# Patient Record
Sex: Female | Born: 1960 | Race: White | Hispanic: No | Marital: Married | Smoking: Former smoker
Health system: Southern US, Community
[De-identification: ages and names within clinical notes are randomized; demographics above are authoritative.]

## PROBLEM LIST (undated history)

## (undated) DIAGNOSIS — K635 Polyp of colon: Secondary | ICD-10-CM

## (undated) DIAGNOSIS — A630 Anogenital (venereal) warts: Secondary | ICD-10-CM

## (undated) DIAGNOSIS — N390 Urinary tract infection, site not specified: Secondary | ICD-10-CM

## (undated) DIAGNOSIS — M1990 Unspecified osteoarthritis, unspecified site: Secondary | ICD-10-CM

## (undated) DIAGNOSIS — C50919 Malignant neoplasm of unspecified site of unspecified female breast: Secondary | ICD-10-CM

## (undated) DIAGNOSIS — Z923 Personal history of irradiation: Secondary | ICD-10-CM

## (undated) HISTORY — PX: FOREARM SURGERY: SHX651

## (undated) HISTORY — DX: Polyp of colon: K63.5

## (undated) HISTORY — DX: Urinary tract infection, site not specified: N39.0

## (undated) HISTORY — PX: COSMETIC SURGERY: SHX468

## (undated) HISTORY — DX: Anogenital (venereal) warts: A63.0

## (undated) HISTORY — PX: AUGMENTATION MAMMAPLASTY: SUR837

## (undated) HISTORY — PX: BREAST SURGERY: SHX581

## (undated) HISTORY — DX: Malignant neoplasm of unspecified site of unspecified female breast: C50.919

## (undated) HISTORY — DX: Unspecified osteoarthritis, unspecified site: M19.90

---

## 1998-10-02 ENCOUNTER — Inpatient Hospital Stay (HOSPITAL_COMMUNITY): Admission: AD | Admit: 1998-10-02 | Discharge: 1998-10-04 | Payer: Self-pay | Admitting: Obstetrics and Gynecology

## 1998-11-22 ENCOUNTER — Other Ambulatory Visit: Admission: RE | Admit: 1998-11-22 | Discharge: 1998-11-22 | Payer: Self-pay | Admitting: Obstetrics and Gynecology

## 1999-07-24 ENCOUNTER — Ambulatory Visit (HOSPITAL_COMMUNITY): Admission: RE | Admit: 1999-07-24 | Discharge: 1999-07-24 | Payer: Self-pay | Admitting: Internal Medicine

## 1999-07-24 ENCOUNTER — Encounter: Payer: Self-pay | Admitting: Internal Medicine

## 1999-11-24 ENCOUNTER — Other Ambulatory Visit: Admission: RE | Admit: 1999-11-24 | Discharge: 1999-11-24 | Payer: Self-pay | Admitting: Obstetrics and Gynecology

## 2000-10-26 HISTORY — PX: AUGMENTATION MAMMAPLASTY: SUR837

## 2000-12-13 ENCOUNTER — Other Ambulatory Visit: Admission: RE | Admit: 2000-12-13 | Discharge: 2000-12-13 | Payer: Self-pay | Admitting: Obstetrics and Gynecology

## 2001-07-20 ENCOUNTER — Ambulatory Visit (HOSPITAL_BASED_OUTPATIENT_CLINIC_OR_DEPARTMENT_OTHER): Admission: RE | Admit: 2001-07-20 | Discharge: 2001-07-21 | Payer: Self-pay | Admitting: *Deleted

## 2001-12-14 ENCOUNTER — Other Ambulatory Visit: Admission: RE | Admit: 2001-12-14 | Discharge: 2001-12-14 | Payer: Self-pay | Admitting: Obstetrics and Gynecology

## 2002-07-12 ENCOUNTER — Encounter: Payer: Self-pay | Admitting: General Surgery

## 2002-07-12 ENCOUNTER — Encounter: Admission: RE | Admit: 2002-07-12 | Discharge: 2002-07-12 | Payer: Self-pay | Admitting: General Surgery

## 2003-07-26 ENCOUNTER — Other Ambulatory Visit: Admission: RE | Admit: 2003-07-26 | Discharge: 2003-07-26 | Payer: Self-pay | Admitting: Obstetrics and Gynecology

## 2003-08-08 ENCOUNTER — Encounter: Admission: RE | Admit: 2003-08-08 | Discharge: 2003-08-08 | Payer: Self-pay | Admitting: Obstetrics and Gynecology

## 2003-08-08 ENCOUNTER — Encounter: Payer: Self-pay | Admitting: Obstetrics and Gynecology

## 2004-08-11 ENCOUNTER — Encounter: Admission: RE | Admit: 2004-08-11 | Discharge: 2004-08-11 | Payer: Self-pay

## 2005-01-06 ENCOUNTER — Ambulatory Visit: Payer: Self-pay | Admitting: Internal Medicine

## 2005-04-02 ENCOUNTER — Ambulatory Visit: Payer: Self-pay | Admitting: Internal Medicine

## 2005-04-03 ENCOUNTER — Encounter: Admission: RE | Admit: 2005-04-03 | Discharge: 2005-04-03 | Payer: Self-pay | Admitting: Internal Medicine

## 2005-11-19 ENCOUNTER — Encounter: Admission: RE | Admit: 2005-11-19 | Discharge: 2005-11-19 | Payer: Self-pay | Admitting: Obstetrics and Gynecology

## 2005-12-02 ENCOUNTER — Ambulatory Visit: Payer: Self-pay | Admitting: Internal Medicine

## 2006-11-30 ENCOUNTER — Encounter: Admission: RE | Admit: 2006-11-30 | Discharge: 2006-11-30 | Payer: Self-pay | Admitting: Obstetrics and Gynecology

## 2007-01-24 ENCOUNTER — Ambulatory Visit: Payer: Self-pay | Admitting: Internal Medicine

## 2007-02-22 ENCOUNTER — Ambulatory Visit: Payer: Self-pay | Admitting: Internal Medicine

## 2007-03-07 DIAGNOSIS — L708 Other acne: Secondary | ICD-10-CM

## 2007-03-18 ENCOUNTER — Encounter: Payer: Self-pay | Admitting: Internal Medicine

## 2007-04-12 ENCOUNTER — Ambulatory Visit: Payer: Self-pay | Admitting: Internal Medicine

## 2007-04-25 ENCOUNTER — Ambulatory Visit: Payer: Self-pay | Admitting: Internal Medicine

## 2007-04-29 LAB — CONVERTED CEMR LAB
Basophils Absolute: 0 10*3/uL (ref 0.0–0.1)
Basophils Relative: 0.4 % (ref 0.0–1.0)
HCT: 37.7 % (ref 36.0–46.0)
Hemoglobin: 12.8 g/dL (ref 12.0–15.0)
Lymphocytes Relative: 38.7 % (ref 12.0–46.0)
MCHC: 33.9 g/dL (ref 30.0–36.0)
MCV: 93.8 fL (ref 78.0–100.0)
RDW: 11.1 % — ABNORMAL LOW (ref 11.5–14.6)
T3, Free: 2.7 pg/mL (ref 2.3–4.2)
TSH: 0.34 microintl units/mL — ABNORMAL LOW (ref 0.35–5.50)

## 2007-05-02 ENCOUNTER — Ambulatory Visit: Payer: Self-pay | Admitting: Internal Medicine

## 2007-05-02 ENCOUNTER — Encounter (INDEPENDENT_AMBULATORY_CARE_PROVIDER_SITE_OTHER): Payer: Self-pay | Admitting: *Deleted

## 2007-05-03 ENCOUNTER — Encounter (INDEPENDENT_AMBULATORY_CARE_PROVIDER_SITE_OTHER): Payer: Self-pay | Admitting: *Deleted

## 2007-05-10 ENCOUNTER — Encounter (INDEPENDENT_AMBULATORY_CARE_PROVIDER_SITE_OTHER): Payer: Self-pay | Admitting: *Deleted

## 2007-05-11 ENCOUNTER — Encounter (INDEPENDENT_AMBULATORY_CARE_PROVIDER_SITE_OTHER): Payer: Self-pay | Admitting: *Deleted

## 2007-09-15 ENCOUNTER — Emergency Department (HOSPITAL_COMMUNITY): Admission: EM | Admit: 2007-09-15 | Discharge: 2007-09-15 | Payer: Self-pay | Admitting: Emergency Medicine

## 2007-09-15 ENCOUNTER — Telehealth: Payer: Self-pay | Admitting: Internal Medicine

## 2007-09-20 ENCOUNTER — Ambulatory Visit (HOSPITAL_COMMUNITY): Admission: RE | Admit: 2007-09-20 | Discharge: 2007-09-20 | Payer: Self-pay | Admitting: Neurosurgery

## 2008-01-27 ENCOUNTER — Encounter: Admission: RE | Admit: 2008-01-27 | Discharge: 2008-01-27 | Payer: Self-pay | Admitting: Obstetrics and Gynecology

## 2008-08-31 ENCOUNTER — Encounter: Payer: Self-pay | Admitting: Internal Medicine

## 2009-02-07 ENCOUNTER — Encounter: Admission: RE | Admit: 2009-02-07 | Discharge: 2009-02-07 | Payer: Self-pay | Admitting: Obstetrics and Gynecology

## 2009-03-20 ENCOUNTER — Encounter: Payer: Self-pay | Admitting: Internal Medicine

## 2009-08-23 ENCOUNTER — Encounter: Payer: Self-pay | Admitting: Internal Medicine

## 2009-10-26 HISTORY — PX: BREAST BIOPSY: SHX20

## 2010-02-27 ENCOUNTER — Encounter: Admission: RE | Admit: 2010-02-27 | Discharge: 2010-02-27 | Payer: Self-pay | Admitting: Obstetrics and Gynecology

## 2010-03-04 ENCOUNTER — Encounter: Admission: RE | Admit: 2010-03-04 | Discharge: 2010-03-04 | Payer: Self-pay | Admitting: Obstetrics and Gynecology

## 2010-09-02 ENCOUNTER — Encounter: Payer: Self-pay | Admitting: Internal Medicine

## 2010-09-04 ENCOUNTER — Encounter: Admission: RE | Admit: 2010-09-04 | Discharge: 2010-09-04 | Payer: Self-pay | Admitting: Obstetrics and Gynecology

## 2010-10-26 ENCOUNTER — Ambulatory Visit (HOSPITAL_COMMUNITY): Admission: RE | Admit: 2010-10-26 | Payer: Self-pay | Admitting: Obstetrics and Gynecology

## 2010-11-23 LAB — CONVERTED CEMR LAB
ALT: 19 units/L (ref 0–40)
Albumin: 3.8 g/dL (ref 3.5–5.2)
BUN: 7 mg/dL (ref 6–23)
CO2: 26 meq/L (ref 19–32)
Chloride: 105 meq/L (ref 96–112)
Eosinophils Absolute: 0.1 10*3/uL (ref 0.0–0.6)
Eosinophils Relative: 2.5 % (ref 0.0–5.0)
Glucose, Bld: 96 mg/dL (ref 70–99)
HCT: 35.3 % — ABNORMAL LOW (ref 36.0–46.0)
Hemoglobin: 12.1 g/dL (ref 12.0–15.0)
Lymphocytes Relative: 27.9 % (ref 12.0–46.0)
MCV: 94.2 fL (ref 78.0–100.0)
Monocytes Absolute: 0.3 10*3/uL (ref 0.2–0.7)
Monocytes Relative: 6.1 % (ref 3.0–11.0)
Neutro Abs: 3.4 10*3/uL (ref 1.4–7.7)
Neutrophils Relative %: 62.9 % (ref 43.0–77.0)
Platelets: 230 10*3/uL (ref 150–400)
Potassium: 3.5 meq/L (ref 3.5–5.1)
TSH: 0.21 microintl units/mL — ABNORMAL LOW (ref 0.35–5.50)
Total Bilirubin: 0.7 mg/dL (ref 0.3–1.2)
Total CHOL/HDL Ratio: 3.6
Total Protein: 5.9 g/dL — ABNORMAL LOW (ref 6.0–8.3)
Triglycerides: 92 mg/dL (ref 0–149)

## 2010-11-27 NOTE — Consult Note (Signed)
Summary: [address] OD  [address] OD   Imported By: Lanelle Bal 09/15/2010 13:19:37  _____________________________________________________________________  External Attachment:    Type:   Image     Comment:   External Document

## 2011-03-10 NOTE — Consult Note (Signed)
NAME:  Stacey Craig, Stacey Craig           ACCOUNT NO.:  [id number]   MEDICAL RECORD NO.:  [id number]          PATIENT TYPE:  EMS   LOCATION:  MAJO                         FACILITY:  [facility]   PHYSICIAN:  Donalee Citrin, M.D.        DATE OF BIRTH:  [birth year]   DATE OF CONSULTATION:  09/15/2007  DATE OF DISCHARGE:                                 CONSULTATION   EMERGENCY ROOM CONSULTATION   REASON FOR CONSULTATION:  Headaches.   HISTORY OF PRESENT ILLNESS:  The patient is a very pleasant 50 year old  female who is very healthy and in her usual state of health until  Wednesday when she noticed a very slow to progress, began early after  lunch, progressed to severe by the evening over several hours, a  headache that is characterized predominantly as being a retroorbital.  Was associated with a little bit of nausea.  She took an ibuprofen, went  to bed, and felt fine.  Did great all day Wednesday, and then last  night, did not remember anything abnormal.  Went to bed, woke up this  morning, and her husband told her last night she was acting a little bit  drunk.  She does not remember any of that.  Currently, she is not  complaining of any headaches.  She denies any numbness or tingling.  She  denies any nausea or vomiting.  She says that these headaches both times  were not associated with any aura.  No unusual smells, scintillations,  bright lights, jagged lines.  Does have a little bit of nausea, there  was no vomiting and no other associated neurological findings, word  finding, double vision or vision loss.   PAST MEDICAL HISTORY:  Unremarkable.   MEDICATIONS:  None except for an occasional oral contraceptive for her  face.  Otherwise, that is it.   PHYSICAL EXAMINATION:  GENERAL:  She is awake, alert and oriented 85-  year-old female in no acute distress.  HEENT:  Within normal limits with pupils equal, round and reactive to  light.  Extraocular movements are intact.  Fundi are sharp with  good  visualization.  No visual-field loss.  Cranial nerves are otherwise  intact.  EXTREMITIES-  Strength is 5/5 in her deltoids, biceps, triceps, wrist  flexors and intrinsics and also lower extremity strength is 5/5  in  iliopsoas, quads, hamstrings, gastrocs, and EHL.Marland Kitchen  CEREBELLAR EXAM:  Negative with no signs of dysmetria.  SENSORY EXAM:  Grossly normal to light touch.   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  This appears to be a simple venous angioma; however, there  is a fairly large vein associated with it.  I have recommended an  outpatient arteriogram.  Continue the use of Tylenol and occasional  Advil for pain.  I explained to her this a very low risk of amnesia, and  I was not even sure it was causing her headaches, that if the  arteriogram confirmed that it is as  characterized a venous angioma, that  probably no treatment would be needed.  Her headaches, I think, are a  different entity, and I have recommended followup with neurology for  headache management, and I will see her back after arteriogram in  approximately 1 week.  In addition, would follow up with Dr. Sharene Skeans.           ______________________________  Donalee Citrin, M.D.     GC/MEDQ  D:  09/15/2007  T:  09/16/2007  Job:  [id number]

## 2011-03-13 NOTE — Letter (Signed)
February 22, 2007    Aundra Dubin, M.D.  [address]  [place], [place] [address]   RE:  Stacey Craig, Stacey Craig  MRN:  [id number]  /  DOB:  1960/11/05   Dear Will:   Jossette was seen on March 31 with pain in the right hand with some  associated swelling.  Arthritis Strength Tylenol at bedtime and 1 to 3  baby aspirin in the morning was recommended.  Only minor PIP changes  were seen.   She returns stating that these interventions were of no benefit.   Now she has pain in the right hand, right shoulder, lower neck and upper  back, as well as the left elbow.  This has been associated with  decreased strength.  She has no constitutional symptoms or rash.   She has competed in high school athletics as well as intramurals in  college and has been a runner with some joint symptoms.   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  At that time, her sed rate and uric acid  were normal, but she did have a rheumatoid factor of 28.6.   Her mother has mild arthritis, but there is no other history of  significant collagen vascular disease.  I will repeat the sed rate and  rheumatoid arthritis factor.  Pending your evaluation, I will prescribe  Voltaren 50 mg twice a day with meals as needed.   On exam, she does have some instability in the left knee, but full range  of motion.  She does describe some discomfort with neck flexion.  Strength was normal to opposition & her deep tendon reflexes were also  WNL.    Sincerely,      Titus Dubin. Alwyn Ren, MD,FACP,FCCP  Electronically Signed    [facility]/MedQ  DD: 02/22/2007  DT: 02/22/2007  Job #: [id number]

## 2011-03-13 NOTE — Op Note (Signed)
. [facility]  Patient:    Stacey Craig, Stacey Craig Visit Number: [id number] MRN: [id number]          Service Type: DSU Location: [facility] Attending Physician:  Kendell Bane Dictated by:   Lowell Bouton, M.D. Proc. Date: 07/20/01 Admit Date:  07/20/2001   CC:         Alinda Deem, M.D.  Titus Dubin. Alwyn Ren, M.D. 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  POSTOPERATIVE DIAGNOSIS:  Central triangular fibrocartilage tear with ulnocarpal abutment, right wrist with nondisplaced articular fracture of the distal radius.  OPERATION PERFORMED:  Diagnostic arthroscopy, right wrist with open ulnar shortening.  SURGEON:  Lowell Bouton, M.D.  ANESTHESIA:  General.  OPERATIVE FINDINGS:  The patient had an articular fracture in the distal radius ulnarly.  The cartilage was not displaced.  The ulnar head was seen easily from the radiocarpal portal through a large central triangular fibrocartilage tear.  The triangular fibrocartilage was completely worn down; however, the distal end of the ulna was smooth and there was not significant articular damage.  There was some dorsal synovitis and the scapholunate ligament and triquetrolunate ligaments appeared to be intact.  DESCRIPTION OF PROCEDURE:  Under general anesthesia with the tourniquet on the left arm, the left hand was prepped and draped in the usual fashion and the index, middle and ring ringers were suspended from finger traps in the arthroscopy tower.  The upper arm was attached to the arm board with a Velcro strap for traction.  10 pounds of traction were applied across the wrist and the radiocarpal joint was inflated with 5 cc of saline.  An outflow was established at the 6-U portal in the ulnocarpal joint.  An 11 blade was used to make an incision just ulnar to the extensor  pollicis longus tendon at the radiocarpal joint.  The 3-4 portal was then established with the arthroscope using a sharp and blunt trocar.  Inflow was applied to the arthroscope and the pump was used for irrigation.  Examination of the radiocarpal joint revealed a smooth proximal scaphoid with no articular damage on the radial side of the radius.  Going more ulnarly the scapholunate ligament appeared to be intact. There was a defect in the articular surface of the radius just on the ulnar aspect.  The articular surface was smooth and there was no need for any debridement.  The shaver was inserted for synovitis dorsally in the 4-5 portal and the synovium dorsally was debrided with a full radius resector. This allowed better visualization of the ulnar side of the wrist.  The triangular fibrocartilage was then examined after clearing off the synovium and there was a large central defect in the triangular fibrocartilage.  The ulnar head protruded through this defect and the articular surface of the distal ulna was smooth.  The ulnar side of the joint otherwise appeared smooth except for the defect in the triangular fibrocartilage.  The arthroscope was then removed and it was determined that an ulnar shortening would be required.  The limb was exsanguinated and the tourniquet was inflated to 225 mmHg.  A 10 cm longitudinal incision was then made on the subcutaneous border of the ulna and carried down through the subcutaneous tissues.  Bleeding points were coagulated.  Sharp dissection was carried down between the flexor and extensor muscle mass onto the subcutaneous border of the ulna.  The periosteum was incised and  a Therapist, nutritional was used to elevate the muscles volarly and dorsally to allow fixation of the Rayhack shortening device.  The saw guide was affixed to the ulna with 3.5 cortical screws from the small fragments. These were placed in the appropriate holes.  This affixed the saw guide  to the ulna.  Parallel saw cuts were then made, cutting the distal one first in the second position and the proximal one in the first position.  This allowed a wafer of bone to be removed measuring 2.5 mm of shortening.  The saw guide was then removed and the plate was attached through the previous drill holes.  A compression device was attached to the plate and the osteotomy site was compressed so it was no longer visible.  A drill guide was then attached to the compression device allowing a 2.7 lag screw from the minifragment set to be inserted across the osteotomy site.  This compressed the osteotomy site. The two distal screws were then inserted using 3.5 mm screws.  The compression device was then removed.  The screws were tightened and x-ray showed good alignment of the osteotomy and good length of the screws.  The shortening appeared to be appropriate of the ulna.  The wound was then copiously irrigated.  The subcutaneous tissue was closed with 4-0 Vicryl over a vessel loop drain.  The skin was closed with a 3-0 subcuticular Prolene. Steri-Strips were applied followed by sterile dressings and a sugar-tong splint.  The patient tolerated the procedure well and went to the recovery room awake and stable in good condition. Dictated by:   Lowell Bouton, M.D. Attending Physician:  Kendell Bane DD:  07/20/01 TD:  07/20/01 Job: [id number]

## 2011-04-27 ENCOUNTER — Other Ambulatory Visit: Payer: Self-pay | Admitting: Obstetrics and Gynecology

## 2011-04-27 DIAGNOSIS — R921 Mammographic calcification found on diagnostic imaging of breast: Secondary | ICD-10-CM

## 2011-05-07 ENCOUNTER — Ambulatory Visit
Admission: RE | Admit: 2011-05-07 | Discharge: 2011-05-07 | Disposition: A | Discharge status: Home / Self Care | Payer: BC Managed Care – PPO | Source: Ambulatory Visit | Attending: Obstetrics and Gynecology | Admitting: Obstetrics and Gynecology

## 2011-05-07 DIAGNOSIS — R921 Mammographic calcification found on diagnostic imaging of breast: Secondary | ICD-10-CM

## 2011-08-04 LAB — DIFFERENTIAL
Basophils Absolute: 0
Eosinophils Absolute: 0 — ABNORMAL LOW
Lymphocytes Relative: 21
Lymphs Abs: 1.7
Monocytes Absolute: 0.2
Neutrophils Relative %: 75

## 2011-08-04 LAB — RAPID URINE DRUG SCREEN, HOSP PERFORMED
Barbiturates: NOT DETECTED
Benzodiazepines: NOT DETECTED
Cocaine: NOT DETECTED
Opiates: NOT DETECTED
Tetrahydrocannabinol: NOT DETECTED

## 2011-08-04 LAB — BASIC METABOLIC PANEL
BUN: 12
CO2: 26
Chloride: 105
GFR calc non Af Amer: >60
Glucose, Bld: 89
Potassium: 3.8
Sodium: 138

## 2011-08-04 LAB — CBC
HCT: 35.6 — ABNORMAL LOW
Hemoglobin: 12.4
MCHC: 34.7
MCHC: 35.3
MCV: 91
Platelets: 174
Platelets: 194
RDW: 11.6
WBC: 7.8

## 2011-08-04 LAB — I-STAT 8, (EC8 V) (CONVERTED LAB)
Hemoglobin: 12.9
Potassium: 4.1
Sodium: 138
pCO2, Ven: 40.1 — ABNORMAL LOW
pH, Ven: 7.379 — ABNORMAL HIGH

## 2011-08-04 LAB — ETHANOL: Alcohol, Ethyl (B): <5

## 2011-08-04 LAB — POCT PREGNANCY, URINE: Operator id: 146091

## 2011-11-10 ENCOUNTER — Other Ambulatory Visit: Payer: Self-pay | Admitting: Obstetrics and Gynecology

## 2011-11-10 DIAGNOSIS — R921 Mammographic calcification found on diagnostic imaging of breast: Secondary | ICD-10-CM

## 2011-11-24 ENCOUNTER — Ambulatory Visit
Admission: RE | Admit: 2011-11-24 | Discharge: 2011-11-24 | Disposition: A | Discharge status: Home / Self Care | Payer: BC Managed Care – PPO | Source: Ambulatory Visit | Attending: Obstetrics and Gynecology | Admitting: Obstetrics and Gynecology

## 2011-11-24 DIAGNOSIS — R921 Mammographic calcification found on diagnostic imaging of breast: Secondary | ICD-10-CM

## 2012-05-19 ENCOUNTER — Other Ambulatory Visit: Payer: Self-pay | Admitting: Obstetrics and Gynecology

## 2012-05-19 DIAGNOSIS — Z1231 Encounter for screening mammogram for malignant neoplasm of breast: Secondary | ICD-10-CM

## 2012-06-13 ENCOUNTER — Ambulatory Visit
Admission: RE | Admit: 2012-06-13 | Discharge: 2012-06-13 | Disposition: A | Discharge status: Home / Self Care | Payer: PRIVATE HEALTH INSURANCE | Source: Ambulatory Visit | Attending: Obstetrics and Gynecology | Admitting: Obstetrics and Gynecology

## 2012-06-13 DIAGNOSIS — Z1231 Encounter for screening mammogram for malignant neoplasm of breast: Secondary | ICD-10-CM

## 2012-06-16 ENCOUNTER — Other Ambulatory Visit: Payer: Self-pay | Admitting: Obstetrics and Gynecology

## 2012-06-16 DIAGNOSIS — R928 Other abnormal and inconclusive findings on diagnostic imaging of breast: Secondary | ICD-10-CM

## 2012-06-20 ENCOUNTER — Ambulatory Visit
Admission: RE | Admit: 2012-06-20 | Discharge: 2012-06-20 | Disposition: A | Discharge status: Home / Self Care | Payer: PRIVATE HEALTH INSURANCE | Source: Ambulatory Visit | Attending: Obstetrics and Gynecology | Admitting: Obstetrics and Gynecology

## 2012-06-20 DIAGNOSIS — R928 Other abnormal and inconclusive findings on diagnostic imaging of breast: Secondary | ICD-10-CM

## 2013-06-23 ENCOUNTER — Other Ambulatory Visit: Payer: Self-pay

## 2013-06-23 DIAGNOSIS — Z1231 Encounter for screening mammogram for malignant neoplasm of breast: Secondary | ICD-10-CM

## 2013-07-25 ENCOUNTER — Ambulatory Visit
Admission: RE | Admit: 2013-07-25 | Discharge: 2013-07-25 | Disposition: A | Discharge status: Home / Self Care | Payer: BC Managed Care – PPO | Source: Ambulatory Visit

## 2013-07-25 DIAGNOSIS — Z1231 Encounter for screening mammogram for malignant neoplasm of breast: Secondary | ICD-10-CM

## 2013-07-31 ENCOUNTER — Other Ambulatory Visit: Payer: Self-pay | Admitting: Obstetrics and Gynecology

## 2013-07-31 DIAGNOSIS — R928 Other abnormal and inconclusive findings on diagnostic imaging of breast: Secondary | ICD-10-CM

## 2013-08-10 ENCOUNTER — Ambulatory Visit
Admission: RE | Admit: 2013-08-10 | Discharge: 2013-08-10 | Disposition: A | Discharge status: Home / Self Care | Payer: BC Managed Care – PPO | Source: Ambulatory Visit | Attending: Obstetrics and Gynecology | Admitting: Obstetrics and Gynecology

## 2013-08-10 DIAGNOSIS — R928 Other abnormal and inconclusive findings on diagnostic imaging of breast: Secondary | ICD-10-CM

## 2014-09-07 ENCOUNTER — Other Ambulatory Visit: Payer: Self-pay

## 2014-09-07 DIAGNOSIS — Z1231 Encounter for screening mammogram for malignant neoplasm of breast: Secondary | ICD-10-CM

## 2014-10-02 ENCOUNTER — Ambulatory Visit
Admission: RE | Admit: 2014-10-02 | Discharge: 2014-10-02 | Disposition: A | Discharge status: Home / Self Care | Payer: BC Managed Care – PPO | Source: Ambulatory Visit

## 2014-10-02 DIAGNOSIS — Z1231 Encounter for screening mammogram for malignant neoplasm of breast: Secondary | ICD-10-CM

## 2014-10-04 ENCOUNTER — Other Ambulatory Visit: Payer: Self-pay | Admitting: Obstetrics and Gynecology

## 2014-10-04 DIAGNOSIS — R928 Other abnormal and inconclusive findings on diagnostic imaging of breast: Secondary | ICD-10-CM

## 2014-10-17 ENCOUNTER — Ambulatory Visit
Admission: RE | Admit: 2014-10-17 | Discharge: 2014-10-17 | Disposition: A | Discharge status: Home / Self Care | Payer: BC Managed Care – PPO | Source: Ambulatory Visit | Attending: Obstetrics and Gynecology | Admitting: Obstetrics and Gynecology

## 2014-10-17 DIAGNOSIS — R928 Other abnormal and inconclusive findings on diagnostic imaging of breast: Secondary | ICD-10-CM

## 2015-04-10 ENCOUNTER — Ambulatory Visit: Payer: Self-pay | Admitting: Family

## 2015-04-24 ENCOUNTER — Other Ambulatory Visit: Payer: Self-pay | Admitting: Obstetrics and Gynecology

## 2015-04-24 ENCOUNTER — Other Ambulatory Visit: Payer: Self-pay | Admitting: Internal Medicine

## 2015-04-24 DIAGNOSIS — C50911 Malignant neoplasm of unspecified site of right female breast: Secondary | ICD-10-CM

## 2015-04-24 DIAGNOSIS — Z9882 Breast implant status: Secondary | ICD-10-CM

## 2015-05-01 ENCOUNTER — Other Ambulatory Visit: Payer: Self-pay

## 2015-05-08 ENCOUNTER — Ambulatory Visit
Admission: RE | Admit: 2015-05-08 | Discharge: 2015-05-08 | Disposition: A | Discharge status: Home / Self Care | Payer: No Typology Code available for payment source | Source: Ambulatory Visit | Attending: Internal Medicine | Admitting: Internal Medicine

## 2015-05-08 ENCOUNTER — Other Ambulatory Visit: Payer: Self-pay | Admitting: Internal Medicine

## 2015-05-08 DIAGNOSIS — C50911 Malignant neoplasm of unspecified site of right female breast: Secondary | ICD-10-CM

## 2015-05-08 DIAGNOSIS — Z9882 Breast implant status: Secondary | ICD-10-CM

## 2015-05-13 ENCOUNTER — Ambulatory Visit: Payer: Self-pay | Admitting: Internal Medicine

## 2015-06-06 ENCOUNTER — Ambulatory Visit: Payer: Self-pay | Admitting: Internal Medicine

## 2015-06-21 ENCOUNTER — Ambulatory Visit (INDEPENDENT_AMBULATORY_CARE_PROVIDER_SITE_OTHER): Payer: No Typology Code available for payment source | Admitting: Internal Medicine

## 2015-06-21 ENCOUNTER — Encounter: Payer: Self-pay | Admitting: Internal Medicine

## 2015-06-21 ENCOUNTER — Other Ambulatory Visit (INDEPENDENT_AMBULATORY_CARE_PROVIDER_SITE_OTHER): Payer: No Typology Code available for payment source

## 2015-06-21 VITALS — BP 118/78 | HR 82 | Temp 98.6°F | Resp 16 | Ht 63.5 in | Wt 145.0 lb

## 2015-06-21 DIAGNOSIS — M069 Rheumatoid arthritis, unspecified: Secondary | ICD-10-CM | POA: Diagnosis not present

## 2015-06-21 DIAGNOSIS — Z0000 Encounter for general adult medical examination without abnormal findings: Secondary | ICD-10-CM | POA: Diagnosis not present

## 2015-06-21 DIAGNOSIS — R635 Abnormal weight gain: Secondary | ICD-10-CM | POA: Diagnosis not present

## 2015-06-21 LAB — LIPID PANEL
CHOLESTEROL: 198 mg/dL (ref 0–200)
HDL: 78.8 mg/dL (ref >39.00)
LDL Cholesterol: 105 mg/dL — ABNORMAL HIGH (ref 0–99)
NonHDL: 118.9
Total CHOL/HDL Ratio: 3
Triglycerides: 70 mg/dL (ref 0.0–149.0)
VLDL: 14 mg/dL (ref 0.0–40.0)

## 2015-06-21 LAB — TSH: TSH: 0.41 u[IU]/mL (ref 0.35–4.50)

## 2015-06-21 LAB — T4, FREE: FREE T4: 0.86 ng/dL (ref 0.60–1.60)

## 2015-06-21 LAB — VITAMIN B12: VITAMIN B 12: 407 pg/mL (ref 211–911)

## 2015-06-21 NOTE — Assessment & Plan Note (Signed)
Check B12 and TSH and free T4. She is at a healthy weight. Spoke with her about exercise and diet to help with weight control.

## 2015-06-21 NOTE — Progress Notes (Signed)
   Subjective:    Patient ID: Stacey Craig, female    DOB: 05-18-1961, 54 y.o.   MRN: [id number]  HPI The patient is a 54 YO female coming in for weight gain. She has noticed in the last 1 year she has put on 25 pounds without significant change in lifestyle. Previously she has had problems with her thyroid and checked in May and was told it was okay. Has concurrent RA which is well controlled. No constipation, tremors, diarrhea. No fatigue or heat or cold intolerance.   PMH, [facility], social history reviewed and updated.   Review of Systems  Constitutional: Positive for unexpected weight change. Negative for fever, activity change, appetite change and fatigue.  HENT: Negative.   Eyes: Negative.   Respiratory: Negative for cough, chest tightness, shortness of breath and wheezing.   Cardiovascular: Negative for chest pain, palpitations and leg swelling.  Gastrointestinal: Negative for nausea, abdominal pain, diarrhea, constipation and abdominal distention.  Musculoskeletal: Negative.   Skin: Negative.   Neurological: Negative.   Psychiatric/Behavioral: Negative.       Objective:   Physical Exam  Constitutional: She is oriented to person, place, and time. She appears well-developed and well-nourished.  HENT:  Head: Normocephalic and atraumatic.  Eyes: EOM are normal.  Neck: Normal range of motion.  Cardiovascular: Normal rate and regular rhythm.   Pulmonary/Chest: Effort normal and breath sounds normal. No respiratory distress. She has no wheezes. She has no rales.  Abdominal: Soft. Bowel sounds are normal. She exhibits no distension. There is no tenderness. There is no rebound.  Musculoskeletal: She exhibits no edema.  Neurological: She is alert and oriented to person, place, and time. Coordination normal.  Skin: Skin is warm and dry.  Psychiatric: She has a normal mood and affect.   Filed Vitals:   06/21/15 1009  BP: 118/78  Pulse: 82  Temp: 98.6 F (37 C)  TempSrc: Oral    Resp: 16  Height: 5' 3.5" (1.613 m)  Weight: 145 lb (65.772 kg)  SpO2: 99%      Assessment & Plan:

## 2015-06-21 NOTE — Assessment & Plan Note (Signed)
Controlled on plaquenil, no flare. Gets eyes checked yearly. Reviewed recent labs she brought with her and CMP and CBC normal.

## 2015-06-21 NOTE — Patient Instructions (Signed)
We will check on the thyroid and the cholesterol to make sure we are not missing anything that could be causing the weight change.   Exercising and trying to lose weight we recommend about 45 minutes per day 6 days per week to help increase the metabolism and burn more calories.   Health Maintenance Adopting a healthy lifestyle and getting preventive care can go a long way to promote health and wellness. Talk with your health care provider about what schedule of regular examinations is right for you. This is a good chance for you to check in with your provider about disease prevention and staying healthy. In between checkups, there are plenty of things you can do on your own. Experts have done a lot of research about which lifestyle changes and preventive measures are most likely to keep you healthy. Ask your health care provider for more information. WEIGHT AND DIET  Eat a healthy diet  Be sure to include plenty of vegetables, fruits, low-fat dairy products, and lean protein.  Do not eat a lot of foods high in solid fats, added sugars, or salt.  Get regular exercise. This is one of the most important things you can do for your health.  Most adults should exercise for at least 150 minutes each week. The exercise should increase your heart rate and make you sweat (moderate-intensity exercise).  Most adults should also do strengthening exercises at least twice a week. This is in addition to the moderate-intensity exercise.  Maintain a healthy weight  Body mass index (BMI) is a measurement that can be used to identify possible weight problems. It estimates body fat based on height and weight. Your health care provider can help determine your BMI and help you achieve or maintain a healthy weight.  For females 32 years of age and older:   A BMI below 18.5 is considered underweight.  A BMI of 18.5 to 24.9 is normal.  A BMI of 25 to 29.9 is considered overweight.  A BMI of 30 and above is  considered obese.  Watch levels of cholesterol and blood lipids  You should start having your blood tested for lipids and cholesterol at 52 years of age, then have this test every 5 years.  You may need to have your cholesterol levels checked more often if:  Your lipid or cholesterol levels are high.  You are older than 53 years of age.  You are at high risk for heart disease.  CANCER SCREENING   Lung Cancer  Lung cancer screening is recommended for adults 83-70 years old who are at high risk for lung cancer because of a history of smoking.  A yearly low-dose CT scan of the lungs is recommended for people who:  Currently smoke.  Have quit within the past 15 years.  Have at least a 30-pack-year history of smoking. A pack year is smoking an average of one pack of cigarettes a day for 1 year.  Yearly screening should continue until it has been 15 years since you quit.  Yearly screening should stop if you develop a health problem that would prevent you from having lung cancer treatment.  Breast Cancer  Practice breast self-awareness. This means understanding how your breasts normally appear and feel.  It also means doing regular breast self-exams. Let your health care provider know about any changes, no matter how small.  If you are in your 20s or 30s, you should have a clinical breast exam (CBE) by a health care provider  every 1-3 years as part of a regular health exam.  If you are 40 or older, have a CBE every year. Also consider having a breast X-ray (mammogram) every year.  If you have a family history of breast cancer, talk to your health care provider about genetic screening.  If you are at high risk for breast cancer, talk to your health care provider about having an MRI and a mammogram every year.  Breast cancer gene (BRCA) assessment is recommended for women who have family members with BRCA-related cancers. BRCA-related cancers  include:  Breast.  Ovarian.  Tubal.  Peritoneal cancers.  Results of the assessment will determine the need for genetic counseling and BRCA1 and BRCA2 testing. Cervical Cancer Routine pelvic examinations to screen for cervical cancer are no longer recommended for nonpregnant women who are considered low risk for cancer of the pelvic organs (ovaries, uterus, and vagina) and who do not have symptoms. A pelvic examination may be necessary if you have symptoms including those associated with pelvic infections. Ask your health care provider if a screening pelvic exam is right for you.   The Pap test is the screening test for cervical cancer for women who are considered at risk.  If you had a hysterectomy for a problem that was not cancer or a condition that could lead to cancer, then you no longer need Pap tests.  If you are older than 65 years, and you have had normal Pap tests for the past 10 years, you no longer need to have Pap tests.  If you have had past treatment for cervical cancer or a condition that could lead to cancer, you need Pap tests and screening for cancer for at least 20 years after your treatment.  If you no longer get a Pap test, assess your risk factors if they change (such as having a new sexual partner). This can affect whether you should start being screened again.  Some women have medical problems that increase their chance of getting cervical cancer. If this is the case for you, your health care provider may recommend more frequent screening and Pap tests.  The human papillomavirus (HPV) test is another test that may be used for cervical cancer screening. The HPV test looks for the virus that can cause cell changes in the cervix. The cells collected during the Pap test can be tested for HPV.  The HPV test can be used to screen women 30 years of age and older. Getting tested for HPV can extend the interval between normal Pap tests from three to five years.  An HPV  test also should be used to screen women of any age who have unclear Pap test results.  After 52 years of age, women should have HPV testing as often as Pap tests.  Colorectal Cancer  This type of cancer can be detected and often prevented.  Routine colorectal cancer screening usually begins at 52 years of age and continues through 52 years of age.  Your health care provider may recommend screening at an earlier age if you have risk factors for colon cancer.  Your health care provider may also recommend using home test kits to check for hidden blood in the stool.  A small camera at the end of a tube can be used to examine your colon directly (sigmoidoscopy or colonoscopy). This is done to check for the earliest forms of colorectal cancer.  Routine screening usually begins at age 50.  Direct examination of the colon should   be repeated every 5-10 years through 52 years of age. However, you may need to be screened more often if early forms of precancerous polyps or small growths are found. Skin Cancer  Check your skin from head to toe regularly.  Tell your health care provider about any new moles or changes in moles, especially if there is a change in a mole's shape or color.  Also tell your health care provider if you have a mole that is larger than the size of a pencil eraser.  Always use sunscreen. Apply sunscreen liberally and repeatedly throughout the day.  Protect yourself by wearing long sleeves, pants, a wide-brimmed hat, and sunglasses whenever you are outside. HEART DISEASE, DIABETES, AND HIGH BLOOD PRESSURE   Have your blood pressure checked at least every 1-2 years. High blood pressure causes heart disease and increases the risk of stroke.  If you are between 55 years and 79 years old, ask your health care provider if you should take aspirin to prevent strokes.  Have regular diabetes screenings. This involves taking a blood sample to check your fasting blood sugar  level.  If you are at a normal weight and have a low risk for diabetes, have this test once every three years after 52 years of age.  If you are overweight and have a high risk for diabetes, consider being tested at a younger age or more often. PREVENTING INFECTION  Hepatitis B  If you have a higher risk for hepatitis B, you should be screened for this virus. You are considered at high risk for hepatitis B if:  You were born in a country where hepatitis B is common. Ask your health care provider which countries are considered high risk.  Your parents were born in a high-risk country, and you have not been immunized against hepatitis B (hepatitis B vaccine).  You have HIV or AIDS.  You use needles to inject street drugs.  You live with someone who has hepatitis B.  You have had sex with someone who has hepatitis B.  You get hemodialysis treatment.  You take certain medicines for conditions, including cancer, organ transplantation, and autoimmune conditions. Hepatitis C  Blood testing is recommended for:  Everyone born from 1945 through 1965.  Anyone with known risk factors for hepatitis C. Sexually transmitted infections (STIs)  You should be screened for sexually transmitted infections (STIs) including gonorrhea and chlamydia if:  You are sexually active and are younger than 52 years of age.  You are older than 52 years of age and your health care provider tells you that you are at risk for this type of infection.  Your sexual activity has changed since you were last screened and you are at an increased risk for chlamydia or gonorrhea. Ask your health care provider if you are at risk.  If you do not have HIV, but are at risk, it may be recommended that you take a prescription medicine daily to prevent HIV infection. This is called pre-exposure prophylaxis (PrEP). You are considered at risk if:  You are sexually active and do not regularly use condoms or know the HIV status  of your partner(s).  You take drugs by injection.  You are sexually active with a partner who has HIV. Talk with your health care provider about whether you are at high risk of being infected with HIV. If you choose to begin PrEP, you should first be tested for HIV. You should then be tested every 3 months for as   long as you are taking PrEP.  PREGNANCY   If you are premenopausal and you may become pregnant, ask your health care provider about preconception counseling.  If you may become pregnant, take 400 to 800 micrograms (mcg) of folic acid every day.  If you want to prevent pregnancy, talk to your health care provider about birth control (contraception). OSTEOPOROSIS AND MENOPAUSE   Osteoporosis is a disease in which the bones lose minerals and strength with aging. This can result in serious bone fractures. Your risk for osteoporosis can be identified using a bone density scan.  If you are 5 years of age or older, or if you are at risk for osteoporosis and fractures, ask your health care provider if you should be screened.  Ask your health care provider whether you should take a calcium or vitamin D supplement to lower your risk for osteoporosis.  Menopause may have certain physical symptoms and risks.  Hormone replacement therapy may reduce some of these symptoms and risks. Talk to your health care provider about whether hormone replacement therapy is right for you.  HOME CARE INSTRUCTIONS   Schedule regular health, dental, and eye exams.  Stay current with your immunizations.   Do not use any tobacco products including cigarettes, chewing tobacco, or electronic cigarettes.  If you are pregnant, do not drink alcohol.  If you are breastfeeding, limit how much and how often you drink alcohol.  Limit alcohol intake to no more than 1 drink per day for nonpregnant women. One drink equals 12 ounces of beer, 5 ounces of wine, or 1 ounces of hard liquor.  Do not use street  drugs.  Do not share needles.  Ask your health care provider for help if you need support or information about quitting drugs.  Tell your health care provider if you often feel depressed.  Tell your health care provider if you have ever been abused or do not feel safe at home. Document Released: 04/27/2011 Document Revised: 02/26/2014 Document Reviewed: 09/13/2013 [facility] Patient Information 2015 [place], [place]. This information is not intended to replace advice given to you by your health care provider. Make sure you discuss any questions you have with your health care provider.

## 2015-06-21 NOTE — Progress Notes (Signed)
Pre visit review using our clinic review tool, if applicable. No additional management support is needed unless otherwise documented below in the visit note. 

## 2015-10-15 ENCOUNTER — Telehealth: Payer: Self-pay

## 2015-10-15 NOTE — Telephone Encounter (Signed)
Left message advising patient that she needs flu vaccine, can call back to schedule nurse visit or call us with info of vaccine if she recd somewhere else

## 2015-12-05 ENCOUNTER — Encounter (HOSPITAL_COMMUNITY): Payer: Self-pay | Admitting: Emergency Medicine

## 2015-12-05 ENCOUNTER — Emergency Department (HOSPITAL_COMMUNITY): Payer: BLUE CROSS/BLUE SHIELD

## 2015-12-05 ENCOUNTER — Emergency Department (HOSPITAL_COMMUNITY)
Admission: EM | Admit: 2015-12-05 | Discharge: 2015-12-05 | Disposition: A | Discharge status: Home / Self Care | Payer: BLUE CROSS/BLUE SHIELD | Attending: Emergency Medicine | Admitting: Emergency Medicine

## 2015-12-05 DIAGNOSIS — Z8619 Personal history of other infectious and parasitic diseases: Secondary | ICD-10-CM | POA: Diagnosis not present

## 2015-12-05 DIAGNOSIS — R61 Generalized hyperhidrosis: Secondary | ICD-10-CM | POA: Diagnosis not present

## 2015-12-05 DIAGNOSIS — R110 Nausea: Secondary | ICD-10-CM | POA: Insufficient documentation

## 2015-12-05 DIAGNOSIS — Z7982 Long term (current) use of aspirin: Secondary | ICD-10-CM | POA: Diagnosis not present

## 2015-12-05 DIAGNOSIS — Z87891 Personal history of nicotine dependence: Secondary | ICD-10-CM | POA: Insufficient documentation

## 2015-12-05 DIAGNOSIS — M1990 Unspecified osteoarthritis, unspecified site: Secondary | ICD-10-CM | POA: Insufficient documentation

## 2015-12-05 DIAGNOSIS — Z87440 Personal history of urinary (tract) infections: Secondary | ICD-10-CM | POA: Insufficient documentation

## 2015-12-05 DIAGNOSIS — Z86010 Personal history of colonic polyps: Secondary | ICD-10-CM | POA: Insufficient documentation

## 2015-12-05 DIAGNOSIS — R55 Syncope and collapse: Secondary | ICD-10-CM | POA: Insufficient documentation

## 2015-12-05 DIAGNOSIS — R0602 Shortness of breath: Secondary | ICD-10-CM | POA: Insufficient documentation

## 2015-12-05 DIAGNOSIS — Z79899 Other long term (current) drug therapy: Secondary | ICD-10-CM | POA: Diagnosis not present

## 2015-12-05 LAB — CBC
HCT: 41.6 % (ref 36.0–46.0)
HEMOGLOBIN: 14.5 g/dL (ref 12.0–15.0)
MCH: 32.4 pg (ref 26.0–34.0)
MCHC: 34.9 g/dL (ref 30.0–36.0)
MCV: 92.9 fL (ref 78.0–100.0)
Platelets: 253 10*3/uL (ref 150–400)
RBC: 4.48 MIL/uL (ref 3.87–5.11)
RDW: 12.2 % (ref 11.5–15.5)
WBC: 7.1 10*3/uL (ref 4.0–10.5)

## 2015-12-05 LAB — BASIC METABOLIC PANEL
ANION GAP: 14 (ref 5–15)
BUN: 8 mg/dL (ref 6–20)
CHLORIDE: 106 mmol/L (ref 101–111)
CO2: 23 mmol/L (ref 22–32)
Calcium: 9.4 mg/dL (ref 8.9–10.3)
Creatinine, Ser: 0.75 mg/dL (ref 0.44–1.00)
Glucose, Bld: 105 mg/dL — ABNORMAL HIGH (ref 65–99)
POTASSIUM: 3.5 mmol/L (ref 3.5–5.1)
SODIUM: 143 mmol/L (ref 135–145)

## 2015-12-05 LAB — I-STAT TROPONIN, ED: TROPONIN I, POC: 0 ng/mL (ref 0.00–0.08)

## 2015-12-05 LAB — TROPONIN I

## 2015-12-05 NOTE — ED Notes (Signed)
Per patient not feeling well this AM. Per pt came to ED after leaving work. On arrival appears anxious, hyperventilating. Lungs CTA,.  Pt instructed in deep breathing. VSS.

## 2015-12-05 NOTE — ED Provider Notes (Signed)
CSN: [id number]     Arrival date & time 12/05/15  1252 History   First MD Initiated Contact with Patient 12/05/15 1600     Chief Complaint  Patient presents with  . Shortness of Breath     (Consider location/radiation/quality/duration/timing/severity/associated sxs/prior Treatment) Patient is a 55 y.o. female presenting with near-syncope. The history is provided by the patient.  Near Syncope This is a new problem. The current episode started today. The problem occurs rarely. The problem has been resolved. Associated symptoms include diaphoresis and nausea. Pertinent negatives include no coughing, fatigue, fever or visual change. Exacerbated by: took her "drinking medications" She has tried nothing for the symptoms. The treatment provided no relief.    Past Medical History  Diagnosis Date  . Arthritis   . Genital warts   . Colon polyps   . UTI (lower urinary tract infection)    Past Surgical History  Procedure Laterality Date  . Breast surgery     Family History  Problem Relation Age of Onset  . Cancer Father     brain, bladder, and skin  . Heart disease Father   . Stroke Father   . Cancer Maternal Grandmother     breast  . Cancer Paternal Grandmother     lung  . Cancer Paternal Grandfather     bone   Social History  Substance Use Topics  . Smoking status: Former Research scientist (life sciences)  . Smokeless tobacco: None  . Alcohol Use: 0.0 oz/week    0 Standard drinks or equivalent per week     Comment: 1/2 bottle- bottle/days   OB History    No data available     Review of Systems  Constitutional: Positive for diaphoresis. Negative for fever and fatigue.  HENT: Negative.   Eyes: Negative.   Respiratory: Negative for cough.   Cardiovascular: Positive for near-syncope.  Gastrointestinal: Positive for nausea.  Genitourinary: Negative.   Musculoskeletal: Negative.   Skin: Negative.   Neurological: Negative.   Psychiatric/Behavioral: Negative.       Allergies  Review of  patient's allergies indicates no known allergies.  Home Medications   Prior to Admission medications   Medication Sig Start Date End Date Taking? Authorizing Provider  aspirin EC 325 MG tablet Take 325 mg by mouth daily as needed for mild pain.   Yes Historical Provider, MD  hydroxychloroquine (PLAQUENIL) 200 MG tablet Take 200 mg by mouth daily. 05/22/15  Yes Historical Provider, MD  naproxen sodium (ANAPROX) 220 MG tablet Take 440 mg by mouth daily as needed (general pain).   Yes Historical Provider, MD  phentermine (ADIPEX-P) 37.5 MG tablet Take 37.5 mg by mouth daily. 11/08/15  Yes Historical Provider, MD   BP 131/88 mmHg  Pulse 81  Temp(Src) 98.1 F (36.7 C) (Oral)  Resp 20  SpO2 100%  LMP 11/13/2011 Physical Exam  Constitutional: She is oriented to person, place, and time. She appears well-developed and well-nourished. No distress.  HENT:  Head: Normocephalic and atraumatic.  Eyes: EOM are normal. Pupils are equal, round, and reactive to light.  Neck: Normal range of motion. Neck supple.  Cardiovascular: Normal rate, regular rhythm, normal heart sounds and intact distal pulses.   Pulmonary/Chest: Effort normal and breath sounds normal. She exhibits no tenderness.  Abdominal: Soft. She exhibits no distension. There is no tenderness. There is no rebound and no guarding.  Musculoskeletal: Normal range of motion. She exhibits no edema or tenderness.  Neurological: She is alert and oriented to person, place, and time. No  cranial nerve deficit. She exhibits normal muscle tone. Coordination normal.  Skin: Skin is warm and dry. No rash noted. She is not diaphoretic. No erythema. No pallor.  Psychiatric: She has a normal mood and affect.  Nursing note and vitals reviewed.   ED Course  Procedures (including critical care time) Labs Review Labs Reviewed  BASIC METABOLIC PANEL - Abnormal; Notable for the following:    Glucose, Bld 105 (*)    All other components within normal limits   TROPONIN I  CBC  TROPONIN I  I-STAT TROPOININ, ED    Imaging Review Dg Chest 2 View  12/05/2015  CLINICAL DATA:  Shortness of breath and dizziness EXAM: CHEST  2 VIEW COMPARISON:  None. FINDINGS: The heart size and mediastinal contours are within normal limits. Both lungs are clear. The visualized skeletal structures are unremarkable. IMPRESSION: No active cardiopulmonary disease. Electronically Signed   By: Inez Catalina M.D.   On: 12/05/2015 13:47   I have personally reviewed and evaluated these images and lab results as part of my medical decision-making.   EKG Interpretation   Date/Time:  Thursday December 05 2015 17:16:45 EST Ventricular Rate:  70 PR Interval:  154 QRS Duration: 116 QT Interval:  433 QTC Calculation: 467 R Axis:   21 Text Interpretation:  Sinus rhythm Consider left atrial enlargement  Nonspecific intraventricular conduction delay No significant change was  found Confirmed by Wyvonnia Dusky  MD, STEPHEN [id number]) on 12/05/2015 5:23:48 PM      MDM   Final diagnoses:  SOB (shortness of breath)    Patient is a 55 year old female who presents with a brief episode of shortness of breath, presyncopal symptoms, flushing, nausea after taking her anti-alcohol abuse medicine that she cannot remember. This was about 30 minutes prior to arrival. She is otherwise been in her usual state of health. Further history and exam as above notable for stable vital signs reassuring physical exam. Patient currently asymptomatic and feels back to her baseline. Patient has a negative delta troponin and CBC and BMP unremarkable. Chest x-ray and EKG unremarkable. No history of cad. Low risk heart score.   Doubt acs, pna, anaphylaxis or PE at this time.  I have reviewed all labs and ekgs. Patient stable for discharge home.  I have reviewed all results with the patient. Advised to f/u with her pcp in 2-3d. Patient agrees to stated plan. All questions answered. Advised to call or return to have any  questions, new symptoms, change in symptoms, or symptoms that they do not understand.     Heriberto Antigua, MD 12/06/15 1052  Ezequiel Essex, MD 12/06/15 [phone]

## 2015-12-05 NOTE — Discharge Instructions (Signed)

## 2016-07-27 ENCOUNTER — Other Ambulatory Visit: Payer: Self-pay | Admitting: Obstetrics and Gynecology

## 2016-07-27 DIAGNOSIS — N6310 Unspecified lump in the right breast, unspecified quadrant: Secondary | ICD-10-CM

## 2016-08-05 ENCOUNTER — Ambulatory Visit: Payer: Self-pay | Admitting: Rheumatology

## 2016-08-07 ENCOUNTER — Other Ambulatory Visit: Payer: BLUE CROSS/BLUE SHIELD

## 2016-08-14 ENCOUNTER — Encounter: Payer: Self-pay | Admitting: Rheumatology

## 2016-08-14 ENCOUNTER — Ambulatory Visit (INDEPENDENT_AMBULATORY_CARE_PROVIDER_SITE_OTHER): Payer: BLUE CROSS/BLUE SHIELD | Admitting: Rheumatology

## 2016-08-14 DIAGNOSIS — M170 Bilateral primary osteoarthritis of knee: Secondary | ICD-10-CM | POA: Diagnosis not present

## 2016-08-14 DIAGNOSIS — M0579 Rheumatoid arthritis with rheumatoid factor of multiple sites without organ or systems involvement: Secondary | ICD-10-CM | POA: Diagnosis not present

## 2016-08-14 DIAGNOSIS — Z79899 Other long term (current) drug therapy: Secondary | ICD-10-CM

## 2016-08-17 ENCOUNTER — Ambulatory Visit
Admission: RE | Admit: 2016-08-17 | Discharge: 2016-08-17 | Disposition: A | Discharge status: Home / Self Care | Payer: BLUE CROSS/BLUE SHIELD | Source: Ambulatory Visit | Attending: Obstetrics and Gynecology | Admitting: Obstetrics and Gynecology

## 2016-08-17 ENCOUNTER — Telehealth: Payer: Self-pay | Admitting: Radiology

## 2016-08-17 DIAGNOSIS — N6310 Unspecified lump in the right breast, unspecified quadrant: Secondary | ICD-10-CM

## 2016-08-17 NOTE — Telephone Encounter (Signed)
Called patient to advise labs received are normal

## 2016-09-04 ENCOUNTER — Other Ambulatory Visit: Payer: Self-pay | Admitting: Rheumatology

## 2016-09-04 DIAGNOSIS — Z79899 Other long term (current) drug therapy: Secondary | ICD-10-CM | POA: Insufficient documentation

## 2016-09-04 NOTE — Telephone Encounter (Signed)
Last visit 08/14/16 Next visit 02/12/17 Labs 08/14/16 WNL sent for scanning  PLQ eye exam 02/05/16 Ok to refill per Dr Estanislado Pandy

## 2016-10-27 ENCOUNTER — Other Ambulatory Visit: Payer: Self-pay | Admitting: Rheumatology

## 2016-10-27 NOTE — Telephone Encounter (Signed)
Labs normal 08/14/16 Last visit 08/14/16 Next visit  02/12/17 Eye exam 02/05/16 normal  Ok to refill per Dr Estanislado Pandy

## 2017-02-12 ENCOUNTER — Ambulatory Visit: Payer: BLUE CROSS/BLUE SHIELD | Admitting: Rheumatology

## 2017-02-19 DIAGNOSIS — H02729 Madarosis of unspecified eye, unspecified eyelid and periocular area: Secondary | ICD-10-CM | POA: Insufficient documentation

## 2017-02-19 NOTE — Progress Notes (Signed)
Office Visit Note  Patient: Stacey Craig             Date of Birth: 11/17/1960           MRN: [id number]             PCP: Hoyt Koch, MD Referring: Hoyt Koch, * Visit Date: 02/22/2017 Occupation: @GUAROCC @    Subjective:  Follow-up   History of Present Illness: Stacey Craig is a 56 y.o. female last seen 08/14/2016 for sero negative rheumatoid arthritis and high-risk prescription.  On the visit on 08/14/2016, her history of present illness was as follows:Last seen in our office on February 04, 2016.  The patient is doing well with her rheumatoid arthritis.  No joint pain, swelling and stiffness.  She is taking her Plaquenil as prescribed at 200 mg once a day.  Her Plaquenil eye exam was normal as of April 2017.  She does have some pain to her knee joints consistent with osteoarthritis.  There was a concern about her eye lashes thinning, but she has been given a medicine called Latisse through her eye doctor and that has resolved.   The eye doctor is Dr. Elson Areas in [place], [place].  She will schedule a Plaquenil eye exam through his office.   Activities of Daily Living:  Patient reports morning stiffness for 5 minutes.   Patient Denies nocturnal pain.  Difficulty dressing/grooming: Denies Difficulty climbing stairs: Denies Difficulty getting out of chair: Denies Difficulty using hands for taps, buttons, cutlery, and/or writing: Denies   Review of Systems  Constitutional: Negative for fatigue.  HENT: Negative for mouth sores and mouth dryness.   Eyes: Negative for dryness.  Respiratory: Negative for shortness of breath.   Gastrointestinal: Negative for constipation and diarrhea.  Musculoskeletal: Negative for myalgias and myalgias.  Skin: Negative for sensitivity to sunlight.  Psychiatric/Behavioral: Negative for decreased concentration and sleep disturbance.    PMFS History:  Patient Active Problem List   Diagnosis Date Noted  . Loss of eyelashes 02/19/2017  . High risk medication use 09/04/2016  . Weight gain 06/21/2015  . Rheumatoid arthritis ([place]) 06/21/2015    Past Medical History:  Diagnosis Date  . Arthritis   . Colon polyps   . Genital warts   . UTI (lower urinary tract infection)     Family History  Problem Relation Age of Onset  . Cancer Father     brain, bladder, and skin  . Heart disease Father   . Stroke Father   . Cancer Maternal Grandmother     breast  . Cancer Paternal Grandmother     lung  . Cancer Paternal Grandfather     bone   Past Surgical History:  Procedure Laterality Date  . BREAST SURGERY    . FOREARM SURGERY Left    Social History   Social History Narrative  . No narrative on file     Objective: Vital Signs: BP 123/86 (BP Location: Left Arm, Patient Position: Sitting, Cuff Size: Normal)   Pulse 79   Resp 13   Ht 5' 4"  (1.626 m)   Wt 156 lb (70.8 kg)   LMP 11/13/2011   BMI 26.78 kg/m    Physical Exam  Constitutional: She is oriented to person, place, and time. She appears well-developed and well-nourished.  HENT:  Head: Normocephalic and atraumatic.  Eyes: EOM are normal. Pupils are equal, round, and reactive to light.  Cardiovascular: Normal rate, regular rhythm and normal  heart sounds.  Exam reveals no gallop and no friction rub.   No murmur heard. Pulmonary/Chest: Effort normal and breath sounds normal. She has no wheezes. She has no rales.  Abdominal: Soft. Bowel sounds are normal. She exhibits no distension. There is no tenderness. There is no guarding. No hernia.  Musculoskeletal: Normal range of motion. She exhibits no edema, tenderness or deformity.  Lymphadenopathy:    She has no cervical adenopathy.  Neurological: She is alert and oriented to person, place, and time. Coordination normal.  Skin: Skin is warm and dry. Capillary refill takes less than 2 seconds. No rash noted.  Psychiatric: She has a normal mood and affect.  Her behavior is normal.  Nursing note and vitals reviewed.    Musculoskeletal Exam:  Full range of motion of all joints Grip strength is equal and strong bilaterally Fiber myalgia tender points are all absent  CDAI Exam: CDAI Homunculus Exam:   Joint Counts:  CDAI Tender Joint count: 0 CDAI Swollen Joint count: 0  Global Assessments:  Patient Global Assessment: 0 Provider Global Assessment: 0 No synovitis on examination today. Patient is doing for her well with her joints. Taking Plaquenil once day AND HAS adequate response   Investigation: No additional findings.  Normal PLQ eye exam 02/05/16   No visits with results within 6 Month(s) from this visit.  Latest known visit with results is:  Admission on 12/05/2015, Discharged on 12/05/2015  Component Date Value Ref Range Status  . Sodium 12/05/2015 143  135 - 145 mmol/L Final  . Potassium 12/05/2015 3.5  3.5 - 5.1 mmol/L Final  . Chloride 12/05/2015 106  101 - 111 mmol/L Final  . CO2 12/05/2015 23  22 - 32 mmol/L Final  . Glucose, Bld 12/05/2015 105* 65 - 99 mg/dL Final  . BUN 12/05/2015 8  6 - 20 mg/dL Final  . Creatinine, Ser 12/05/2015 0.75  0.44 - 1.00 mg/dL Final  . Calcium 12/05/2015 9.4  8.9 - 10.3 mg/dL Final  . GFR calc non Af Amer 12/05/2015 >60  >60 mL/min Final  . GFR calc Af Amer 12/05/2015 >60  >60 mL/min Final   Comment: (NOTE) The eGFR has been calculated using the CKD EPI equation. This calculation has not been validated in all clinical situations. eGFR's persistently <60 mL/min signify possible Chronic Kidney Disease.   . Anion gap 12/05/2015 14  5 - 15 Final  . Troponin I 12/05/2015 <0.03  <0.031 ng/mL Final   Comment:        NO INDICATION OF MYOCARDIAL INJURY.   . WBC 12/05/2015 7.1  4.0 - 10.5 K/uL Final  . RBC 12/05/2015 4.48  3.87 - 5.11 MIL/uL Final  . Hemoglobin 12/05/2015 14.5  12.0 - 15.0 g/dL Final  . HCT 12/05/2015 41.6  36.0 - 46.0 % Final  . MCV 12/05/2015 92.9  78.0 - 100.0 fL  Final  . MCH 12/05/2015 32.4  26.0 - 34.0 pg Final  . MCHC 12/05/2015 34.9  30.0 - 36.0 g/dL Final  . RDW 12/05/2015 12.2  11.5 - 15.5 % Final  . Platelets 12/05/2015 253  150 - 400 K/uL Final  . Troponin i, poc 12/05/2015 0.00  0.00 - 0.08 ng/mL Final  . Comment 3 12/05/2015          Final   Comment: Due to the release kinetics of cTnI, a negative result within the first hours of the onset of symptoms does not rule out myocardial infarction with certainty. If myocardial infarction is  still suspected, repeat the test at appropriate intervals.   . Troponin I 12/05/2015 <0.03  <0.031 ng/mL Final   Comment:        NO INDICATION OF MYOCARDIAL INJURY.      Imaging: No results found.  Speciality Comments: No specialty comments available.    Procedures:  No procedures performed Allergies: Patient has no known allergies.   Assessment / Plan:     Visit Diagnoses: Rheumatoid arthritis with negative rheumatoid factor, involving unspecified site [facility]) - 02/22/2017 ==>Seronegative rheumatoid arthritis; adequate response  High risk medication use - 02/22/2017 ==> Plaquenil 200 mg QD /// Normal PLQ eye exam 02/05/16  /// - Plan: CBC with Differential/Platelet, COMPLETE METABOLIC PANEL WITH GFR  Primary osteoarthritis of both knees  Madarosis of eyelid, unspecified laterality    Plan: #1: Seronegative rheumatoid arthritis. Doing well. No joint pain swelling or stiffness. Morning stiffness is less than 5 minutes.   #2: High risk medication. On Plaquenil 200 mg once a day Plaquenil eye exam is normal as of 02/05/2016 (patient is due for repeat Plaquenil eye exam this month. She plans to go and get it done after making an appointment). CBC with differential and CMP with GFR from February 2017 are within normal limits.  #3:Standing order today in office, and then every 5 months (starting September 2018 at ===>next office visit.)  #4: Return to clinic in 5 months and do lab work at  that office visit in September (pt will bring copy of completed plq eye exam at next office visit in case eye doctor does not send it to [place]).  Orders: Orders Placed This Encounter  Procedures  . CBC with Differential/Platelet  . COMPLETE METABOLIC PANEL WITH GFR   Meds ordered this encounter  Medications  . hydroxychloroquine (PLAQUENIL) 200 MG tablet    Sig: Take 1 tablet (200 mg total) by mouth daily.    Dispense:  90 tablet    Refill:  1    Order Specific Question:   Supervising Provider    Answer:   Bo Merino [phone]    Face-to-face time spent with patient was 30 minutes. 50% of time was spent in counseling and coordination of care.  Follow-Up Instructions: Return in about 5 months (around 07/25/2017) for RA, plq 200 qd,left eye injection, plq eye exam due now - will go// labs today and in 5 mos.   Eliezer Lofts, PA-C  Note - This record has been created using [employer].  Chart creation errors have been sought, but may not always  have been located. Such creation errors do not reflect on  the standard of medical care.

## 2017-02-22 ENCOUNTER — Encounter: Payer: Self-pay | Admitting: Rheumatology

## 2017-02-22 ENCOUNTER — Ambulatory Visit (INDEPENDENT_AMBULATORY_CARE_PROVIDER_SITE_OTHER): Payer: No Typology Code available for payment source | Admitting: Rheumatology

## 2017-02-22 VITALS — BP 123/86 | HR 79 | Resp 13 | Ht 64.0 in | Wt 156.0 lb

## 2017-02-22 DIAGNOSIS — M170 Bilateral primary osteoarthritis of knee: Secondary | ICD-10-CM

## 2017-02-22 DIAGNOSIS — Z79899 Other long term (current) drug therapy: Secondary | ICD-10-CM

## 2017-02-22 DIAGNOSIS — M0600 Rheumatoid arthritis without rheumatoid factor, unspecified site: Secondary | ICD-10-CM | POA: Diagnosis not present

## 2017-02-22 DIAGNOSIS — H02729 Madarosis of unspecified eye, unspecified eyelid and periocular area: Secondary | ICD-10-CM

## 2017-02-22 LAB — CBC WITH DIFFERENTIAL/PLATELET
BASOS PCT: 0 %
Basophils Absolute: 0 cells/uL (ref 0–200)
Eosinophils Absolute: 240 cells/uL (ref 15–500)
Eosinophils Relative: 4 %
HCT: 41.7 % (ref 35.0–45.0)
HEMOGLOBIN: 14.3 g/dL (ref 11.7–15.5)
LYMPHS ABS: 2100 {cells}/uL (ref 850–3900)
Lymphocytes Relative: 35 %
MCH: 32.9 pg (ref 27.0–33.0)
MCHC: 34.3 g/dL (ref 32.0–36.0)
MCV: 95.9 fL (ref 80.0–100.0)
MPV: 9.3 fL (ref 7.5–12.5)
Monocytes Absolute: 420 cells/uL (ref 200–950)
Monocytes Relative: 7 %
NEUTROS ABS: 3240 {cells}/uL (ref 1500–7800)
Neutrophils Relative %: 54 %
Platelets: 269 10*3/uL (ref 140–400)
RBC: 4.35 MIL/uL (ref 3.80–5.10)
RDW: 12.9 % (ref 11.0–15.0)
WBC: 6 10*3/uL (ref 3.8–10.8)

## 2017-02-22 LAB — CMP 10231
AG Ratio: 1.9 Ratio (ref 1.0–2.5)
ALBUMIN: 4.4 g/dL (ref 3.6–5.1)
ALT: 18 U/L (ref 6–29)
AST: 20 U/L (ref 10–35)
Alkaline Phosphatase: 35 U/L (ref 33–130)
BUN / CREAT RATIO: 14.5 ratio (ref 6–22)
BUN: 12 mg/dL (ref 7–25)
CHLORIDE: 102 mmol/L (ref 98–110)
CO2: 24 mmol/L (ref 20–31)
Calcium: 9.4 mg/dL (ref 8.6–10.4)
Creat: 0.83 mg/dL (ref 0.50–1.05)
GFR, Est African American: >89 mL/min (ref >=60)
GFR, Est Non African American: 81 mL/min (ref >=60)
Globulin: 2.3 g/dL (ref 1.9–3.7)
Glucose, Bld: 88 mg/dL (ref 65–99)
POTASSIUM: 4.5 mmol/L (ref 3.5–5.3)
SODIUM: 138 mmol/L (ref 135–146)
TOTAL PROTEIN: 6.7 g/dL (ref 6.1–8.1)
Total Bilirubin: 0.7 mg/dL (ref 0.2–1.2)

## 2017-02-22 MED ORDER — HYDROXYCHLOROQUINE SULFATE 200 MG PO TABS: 200.0000 mg | ORAL_TABLET | Freq: Every day | ORAL | 1 refills | Status: AC

## 2017-02-22 NOTE — Patient Instructions (Signed)
   New fax number: [phone]

## 2017-03-31 ENCOUNTER — Telehealth: Payer: Self-pay | Admitting: Rheumatology

## 2017-03-31 NOTE — Telephone Encounter (Signed)
Left message for patient to advise we have received PLQ eye exam.

## 2017-03-31 NOTE — Telephone Encounter (Signed)
Patient left a message this morning stating that she sent a fax in regards to her eye exam for PLQ.  She wants to know if we received her fax, if not, please call her and let her know and she will send another one.  [phone].  Thank you.

## 2017-07-13 ENCOUNTER — Ambulatory Visit: Payer: 59 | Admitting: Rheumatology

## 2017-07-26 ENCOUNTER — Ambulatory Visit: Payer: 59 | Admitting: Rheumatology

## 2017-07-29 ENCOUNTER — Other Ambulatory Visit: Payer: Self-pay | Admitting: Obstetrics and Gynecology

## 2017-07-29 DIAGNOSIS — Z1231 Encounter for screening mammogram for malignant neoplasm of breast: Secondary | ICD-10-CM

## 2017-08-12 ENCOUNTER — Ambulatory Visit: Payer: 59 | Admitting: Rheumatology

## 2017-08-23 ENCOUNTER — Ambulatory Visit
Admission: RE | Admit: 2017-08-23 | Discharge: 2017-08-23 | Disposition: A | Discharge status: Home / Self Care | Payer: No Typology Code available for payment source | Source: Ambulatory Visit | Attending: Obstetrics and Gynecology | Admitting: Obstetrics and Gynecology

## 2017-08-23 DIAGNOSIS — Z1231 Encounter for screening mammogram for malignant neoplasm of breast: Secondary | ICD-10-CM

## 2017-09-07 ENCOUNTER — Telehealth: Payer: Self-pay | Admitting: Rheumatology

## 2017-09-07 MED ORDER — DICLOFENAC SODIUM 1 % TD GEL: TRANSDERMAL | 3 refills | Status: DC

## 2017-09-07 NOTE — Telephone Encounter (Signed)
Last Visit: 02/22/17 Next Visit: 12/23/17  Okay to refill per Dr. Estanislado Pandy

## 2017-09-07 NOTE — Telephone Encounter (Signed)
Patient requesting a refill on Voltaren Gel. Patient uses [facility] in [place]. Please call with concerns.

## 2017-11-01 ENCOUNTER — Encounter: Payer: Self-pay | Admitting: Internal Medicine

## 2017-11-01 ENCOUNTER — Ambulatory Visit (INDEPENDENT_AMBULATORY_CARE_PROVIDER_SITE_OTHER): Payer: No Typology Code available for payment source | Admitting: Internal Medicine

## 2017-11-01 DIAGNOSIS — G44229 Chronic tension-type headache, not intractable: Secondary | ICD-10-CM | POA: Diagnosis not present

## 2017-11-01 NOTE — Patient Instructions (Signed)
Analgesic Rebound Headache An analgesic rebound headache, sometimes called a medication overuse headache, is a headache that comes after pain medicine (analgesic) taken to treat the original (primary) headache has worn off. Any type of primary headache can return as a rebound headache if a person regularly takes analgesics more than three times a week to treat it. The types of primary headaches that are commonly associated with rebound headaches include:  Migraines.  Headaches that arise from tense muscles in the head and neck area (tension headaches).  Headaches that develop and happen again (recur) on one side of the head and around the eye (cluster headaches).  If rebound headaches continue, they become chronic daily headaches. What are the causes? This condition may be caused by frequent use of:  Over-the-counter medicines such as aspirin, ibuprofen, and acetaminophen.  Sinus relief medicines and other medicines that contain caffeine.  Narcotic pain medicines such as codeine and oxycodone.  What are the signs or symptoms? The symptoms of a rebound headache are the same as the symptoms of the original headache. Some of the symptoms of specific types of headaches include: Migraine headache  Pulsing or throbbing pain on one or both sides of the head.  Severe pain that interferes with daily activities.  Pain that is worsened by physical activity.  Nausea, vomiting, or both.  Pain with exposure to bright light, loud noises, or strong smells.  General sensitivity to bright light, loud noises, or strong smells.  Visual changes.  Numbness of one or both arms. Tension headache  Pressure around the head.  Dull, aching head pain.  Pain felt over the front and sides of the head.  Tenderness in the muscles of the head, neck, and shoulders. Cluster headache  Severe pain that begins in or around one eye or temple.  Redness and tearing in the eye on the same side as the  pain.  Droopy or swollen eyelid.  One-sided head pain.  Nausea.  Runny nose.  Sweaty, pale facial skin.  Restlessness. How is this diagnosed? This condition is diagnosed by:  Reviewing your medical history. This includes the nature of your primary headaches.  Reviewing the types of pain medicines that you have been using to treat your headaches and how often you take them.  How is this treated? This condition may be treated or managed by:  Discontinuing frequent use of the analgesic medicine. Doing this may worsen your headaches at first, but the pain should eventually become more manageable, less frequent, and less severe.  Seeing a headache specialist. He or she may be able to help you manage your headaches and help make sure there is not another cause of the headaches.  Using methods of stress relief, such as acupuncture, counseling, biofeedback, and massage. Talk with your health care provider about which methods might be good for you.  Follow these instructions at home:  Take over-the-counter and prescription medicines only as told by your health care provider.  Stop the repeated use of pain medicine as told by your health care provider. Stopping can be difficult. Carefully follow instructions from your health care provider.  Avoid triggers that are known to cause your primary headaches.  Keep all follow-up visits as told by your health care provider. This is important. Contact a health care provider if:  You continue to experience headaches after following treatments that your health care provider recommended. Get help right away if:  You develop new headache pain.  You develop headache pain that is different   than what you have experienced in the past.  You develop numbness or tingling in your arms or legs.  You develop changes in your speech or vision. This information is not intended to replace advice given to you by your health care provider. Make sure you  discuss any questions you have with your health care provider. Document Released: 01/02/2004 Document Revised: 05/01/2016 Document Reviewed: 03/16/2016 Elsevier Interactive Patient Education  2018 Elsevier Inc.  

## 2017-11-01 NOTE — Progress Notes (Signed)
   Subjective:    Patient ID: Stacey Craig, female    DOB: 09-07-1961, 57 y.o.   MRN: [id number]  HPI The patient is a 57 YO female coming in for more frequent mild headaches. She is able to take an advil or ibuprofen for the headache. She denies vision changes, numbness, weakness. She denies any aura symptoms and they are not severe as migraines. She denies change in diet or sleep lately. She is going through perimenopause lately and that has throw some things out of balance. They started several months ago. She decided to come in since they are there all the time now. Denies change in caffeine. Has not changed any of her activities and does not have to leave work for this. Takes advil about 3-4 times per week typically and suffers through the minor headaches.   Review of Systems  Constitutional: Negative.   HENT: Negative.   Eyes: Negative.   Respiratory: Negative for cough, chest tightness and shortness of breath.   Cardiovascular: Negative for chest pain, palpitations and leg swelling.  Gastrointestinal: Negative for abdominal distention, abdominal pain, constipation, diarrhea, nausea and vomiting.  Musculoskeletal: Negative.   Skin: Negative.   Neurological: Positive for headaches. Negative for dizziness, tremors, syncope, weakness, light-headedness and numbness.  Psychiatric/Behavioral: Negative.       Objective:   Physical Exam  Constitutional: She is oriented to person, place, and time. She appears well-developed and well-nourished.  HENT:  Head: Normocephalic and atraumatic.  Eyes: EOM are normal.  Neck: Normal range of motion.  Cardiovascular: Normal rate and regular rhythm.  Pulmonary/Chest: Effort normal and breath sounds normal. No respiratory distress. She has no wheezes. She has no rales.  Abdominal: Soft. Bowel sounds are normal. She exhibits no distension. There is no tenderness. There is no rebound.  Musculoskeletal: She exhibits no edema.  Neurological: She is  alert and oriented to person, place, and time. Coordination normal.  Skin: Skin is warm and dry.  Psychiatric: She has a normal mood and affect.   Vitals:   11/01/17 1134  BP: 108/80  Pulse: 64  Temp: 98.6 F (37 C)  TempSrc: Oral  SpO2: 99%  Weight: 162 lb (73.5 kg)  Height: 5\' 4"  (1.626 m)      Assessment & Plan:

## 2017-11-03 DIAGNOSIS — R51 Headache: Secondary | ICD-10-CM

## 2017-11-03 DIAGNOSIS — R519 Headache, unspecified: Secondary | ICD-10-CM | POA: Insufficient documentation

## 2017-11-03 NOTE — Assessment & Plan Note (Signed)
Suspect combination of estrogen therapy for menopause symptoms or analgesic rebound from chronic usage of the advil or ibuprofen. She is asked to limit usage of that as able to help see if we can break the pattern. Do not suspect imaging is needed at this time. If worsening or persistent can consider that in the future.

## 2017-11-26 NOTE — Progress Notes (Signed)
Office Visit Note  Patient: Stacey Craig             Date of Birth: 12-04-1960           MRN: [id number]             PCP: Hoyt Koch, MD Referring: Hoyt Koch, * Visit Date: 11/29/2017 Occupation: @GUAROCC @    Subjective:  Medication monitoring    History of Present Illness: Stacey Craig is a 57 y.o. female with history of seronegative rheumatoid arthritis.  Patient states she continues to take Plaquenil 200 mg 1 tablet daily.  She had her eye exam on 04/08/17.  She reports she has not had labs since her last visit.  She states that she has occasional joint pain in her knees, ankles, and elbows due to osteoarthritis.  She denies any joint swelling.  She has mild morning stiffness that last about 5 minutes.  She uses voltaren gel as needed if she has aches in her joints.  She denies any hand or feet pain.  She denies any recent flares of rheumatoid arthritis.     Activities of Daily Living:  Patient reports morning stiffness for 5 minutes.   Patient Denies nocturnal pain.  Difficulty dressing/grooming: Denies Difficulty climbing stairs: Denies Difficulty getting out of chair: Denies Difficulty using hands for taps, buttons, cutlery, and/or writing: Denies   Review of Systems  Constitutional: Negative for fatigue and weakness.  HENT: Positive for mouth sores. Negative for mouth dryness and nose dryness.   Eyes: Negative for redness, visual disturbance and dryness.  Respiratory: Negative for cough, hemoptysis, shortness of breath and difficulty breathing.   Cardiovascular: Negative for chest pain, palpitations, hypertension, irregular heartbeat and swelling in legs/feet.  Gastrointestinal: Negative for blood in stool, constipation and diarrhea.  Genitourinary: Negative for painful urination.  Musculoskeletal: Positive for arthralgias, joint pain and morning stiffness. Negative for joint swelling, myalgias, muscle weakness, muscle tenderness and  myalgias.  Skin: Positive for color change. Negative for pallor, rash, hair loss, nodules/bumps, redness, skin tightness, ulcers and sensitivity to sunlight.  Allergic/Immunologic: Negative for susceptible to infections.  Neurological: Negative for dizziness, numbness and headaches.  Hematological: Negative for swollen glands.  Psychiatric/Behavioral: Negative for depressed mood and sleep disturbance. The patient is nervous/anxious.     PMFS History:  Patient Active Problem List   Diagnosis Date Noted  . Chronic headaches 11/03/2017  . High risk medication use 09/04/2016  . Weight gain 06/21/2015  . Rheumatoid arthritis ([place]) 06/21/2015    Past Medical History:  Diagnosis Date  . Arthritis   . Colon polyps   . Genital warts   . UTI (lower urinary tract infection)     Family History  Problem Relation Age of Onset  . Cancer Father        brain, bladder, and skin  . Heart disease Father   . Stroke Father   . Cancer Maternal Grandmother        breast  . Cancer Paternal Grandmother        lung  . Cancer Paternal Grandfather        bone   Past Surgical History:  Procedure Laterality Date  . AUGMENTATION MAMMAPLASTY Bilateral   . BREAST SURGERY    . FOREARM SURGERY Left    Social History   Social History Narrative  . Not on file     Objective: Vital Signs: BP 113/73 (BP Location: Left Arm, Patient Position: Sitting, Cuff Size: Normal)  Pulse 77   Resp 15   Ht 5' 3.5" (1.613 m)   Wt 163 lb (73.9 kg)   LMP 11/13/2011   BMI 28.42 kg/m    Physical Exam  Constitutional: She is oriented to person, place, and time. She appears well-developed and well-nourished.  HENT:  Head: Normocephalic and atraumatic.  Eyes: Conjunctivae and EOM are normal.  Neck: Normal range of motion.  Cardiovascular: Normal rate, regular rhythm, normal heart sounds and intact distal pulses.  Pulmonary/Chest: Effort normal and breath sounds normal.  Abdominal: Soft. Bowel sounds are normal.   Lymphadenopathy:    She has no cervical adenopathy.  Neurological: She is alert and oriented to person, place, and time.  Skin: Skin is warm and dry. Capillary refill takes less than 2 seconds.  Psychiatric: She has a normal mood and affect. Her behavior is normal.  Nursing note and vitals reviewed.    Musculoskeletal Exam: C-spine, thoracic, and lumbar spine good ROM.  No midline spinal tenderness.  No SI joint tenderness.  Shoulder joints, elbow joints, wrist joints, MCPs, PIPs, and DIPs good ROM with no synovitis.  Right 4th finger diffusely swollen.  No warmth or bruising from recent injury.  No warmth or synovitis.  Hip joints, knee joints, ankle joints, MTPs, PIPs, and DIPs good ROM with no synovitis.  No warmth or effusion of knees.  No knee crepitus.  No trochanteric bursitis.   CDAI Exam: CDAI Homunculus Exam:   Joint Counts:  CDAI Tender Joint count: 0 CDAI Swollen Joint count: 0  Global Assessments:  Patient Global Assessment: 1 Provider Global Assessment: 1  CDAI Calculated Score: 2    Investigation: No additional findings. CBC Latest Ref Rng & Units 02/22/2017 12/05/2015 09/20/2007  WBC 3.8 - 10.8 K/uL 6.0 7.1 4.6  Hemoglobin 11.7 - 15.5 g/dL 14.3 14.5 12.4  Hematocrit 35.0 - 45.0 % 41.7 41.6 35.6(L)  Platelets 140 - 400 K/uL 269 253 174   CMP Latest Ref Rng & Units 02/22/2017 12/05/2015 09/20/2007  Glucose 65 - 99 mg/dL 88 105(H) 89  BUN 7 - 25 mg/dL 12 8 12   Creatinine 0.50 - 1.05 mg/dL 0.83 0.75 0.71  Sodium 135 - 146 mmol/L 138 143 138  Potassium 3.5 - 5.3 mmol/L 4.5 3.5 3.8  Chloride 98 - 110 mmol/L 102 106 105  CO2 20 - 31 mmol/L 24 23 26   Calcium 8.6 - 10.4 mg/dL 9.4 9.4 8.9  Total Protein 6.1 - 8.1 g/dL 6.7 - -  Total Bilirubin 0.2 - 1.2 mg/dL 0.7 - -  Alkaline Phos 33 - 130 U/L 35 - -  AST 10 - 35 U/L 20 - -  ALT 6 - 29 U/L 18 - -    Imaging: No results found.  Speciality Comments: No specialty comments available.    Procedures:  No  procedures performed Allergies: Patient has no known allergies.   Assessment / Plan:     Visit Diagnoses: Rheumatoid arthritis with negative rheumatoid factor, involving unspecified site [facility]): She has no synovitis on exam. No tenderness of MCPs or MTPs. She is clinically doing well on Plaquenil 200 mg 1 tablet daily. Eye exam was performed 04/08/17.  She was given an eye exam form.  CBC and CMP labs were performed today.  A refill was sent for plaquenil.  She experiences occasional joint discomfort with weather changes or with increased activity, mainly in her knees and ankles.  She uses Voltaren gel, which helps relieve her discomfort.    High risk medication  use - Plaquenil 200 mg 1 tablet daily, Voltaren geldue for labs-PLQ eye exam: 04/08/17-CBC and CMP were drawn today.  She will be due for labs in 5 months at her follow up visit. - Plan: CBC with Differential/Platelet, COMPLETE METABOLIC PANEL WITH GFR  Chronic nonintractable headache, unspecified headache type: Stable.   Orders: Orders Placed This Encounter  Procedures  . CBC with Differential/Platelet  . COMPLETE METABOLIC PANEL WITH GFR   Meds ordered this encounter  Medications  . hydroxychloroquine (PLAQUENIL) 200 MG tablet    Sig: Plaquenil 200 mg tablet. Take 1 tablet by mouth daily.    Dispense:  30 tablet    Refill:  2      Follow-Up Instructions: Return in about 5 months (around 04/28/2018) for Rheumatoid arthritis.   Ofilia Neas, PA-C  Note - This record has been created using [employer] software.  Chart creation errors have been sought, but may not always  have been located. Such creation errors do not reflect on  the standard of medical care.

## 2017-11-29 ENCOUNTER — Encounter: Payer: Self-pay | Admitting: Physician Assistant

## 2017-11-29 ENCOUNTER — Ambulatory Visit (INDEPENDENT_AMBULATORY_CARE_PROVIDER_SITE_OTHER): Payer: No Typology Code available for payment source | Admitting: Physician Assistant

## 2017-11-29 VITALS — BP 113/73 | HR 77 | Resp 15 | Ht 63.5 in | Wt 163.0 lb

## 2017-11-29 DIAGNOSIS — R51 Headache: Secondary | ICD-10-CM

## 2017-11-29 DIAGNOSIS — M0600 Rheumatoid arthritis without rheumatoid factor, unspecified site: Secondary | ICD-10-CM

## 2017-11-29 DIAGNOSIS — G8929 Other chronic pain: Secondary | ICD-10-CM

## 2017-11-29 DIAGNOSIS — Z79899 Other long term (current) drug therapy: Secondary | ICD-10-CM

## 2017-11-29 MED ORDER — HYDROXYCHLOROQUINE SULFATE 200 MG PO TABS: ORAL_TABLET | ORAL | 2 refills | Status: DC

## 2017-11-30 LAB — CBC WITH DIFFERENTIAL/PLATELET
BASOS PCT: 0.6 %
Basophils Absolute: 38 cells/uL (ref 0–200)
EOS ABS: 122 {cells}/uL (ref 15–500)
Eosinophils Relative: 1.9 %
HEMATOCRIT: 40.3 % (ref 35.0–45.0)
Hemoglobin: 14.4 g/dL (ref 11.7–15.5)
LYMPHS ABS: 2208 {cells}/uL (ref 850–3900)
MCH: 31.2 pg (ref 27.0–33.0)
MCHC: 35.7 g/dL (ref 32.0–36.0)
MCV: 87.2 fL (ref 80.0–100.0)
MPV: 10.1 fL (ref 7.5–12.5)
Monocytes Relative: 6.4 %
NEUTROS PCT: 56.6 %
Neutro Abs: 3622 cells/uL (ref 1500–7800)
PLATELETS: 264 10*3/uL (ref 140–400)
RBC: 4.62 10*6/uL (ref 3.80–5.10)
RDW: 11.7 % (ref 11.0–15.0)
Total Lymphocyte: 34.5 %
WBC: 6.4 10*3/uL (ref 3.8–10.8)
WBCMIX: 410 {cells}/uL (ref 200–950)

## 2017-11-30 LAB — COMPLETE METABOLIC PANEL WITH GFR
AG Ratio: 2.1 (calc) (ref 1.0–2.5)
ALBUMIN MSPROF: 4.5 g/dL (ref 3.6–5.1)
ALKALINE PHOSPHATASE (APISO): 35 U/L (ref 33–130)
ALT: 11 U/L (ref 6–29)
AST: 15 U/L (ref 10–35)
BUN: 10 mg/dL (ref 7–25)
CO2: 28 mmol/L (ref 20–32)
CREATININE: 0.84 mg/dL (ref 0.50–1.05)
Calcium: 9.9 mg/dL (ref 8.6–10.4)
Chloride: 104 mmol/L (ref 98–110)
GFR, EST AFRICAN AMERICAN: 91 mL/min/{1.73_m2} (ref >=60)
GFR, Est Non African American: 79 mL/min/{1.73_m2} (ref >=60)
GLOBULIN: 2.1 g/dL (ref 1.9–3.7)
Glucose, Bld: 85 mg/dL (ref 65–99)
Potassium: 3.9 mmol/L (ref 3.5–5.3)
SODIUM: 140 mmol/L (ref 135–146)
TOTAL PROTEIN: 6.6 g/dL (ref 6.1–8.1)
Total Bilirubin: 0.5 mg/dL (ref 0.2–1.2)

## 2017-11-30 NOTE — Progress Notes (Signed)
Labs are WNL.

## 2017-12-23 ENCOUNTER — Ambulatory Visit: Payer: 59 | Admitting: Rheumatology

## 2018-03-27 ENCOUNTER — Other Ambulatory Visit: Payer: Self-pay | Admitting: Physician Assistant

## 2018-03-28 NOTE — Telephone Encounter (Signed)
Last Visit: 11/29/17 Next Visit: 05/16/18 Labs: 11/29/17 WNL PLQ Eye Exam 03/29/17 WNL  Okay to refill per Dr. Estanislado Pandy

## 2018-05-02 NOTE — Progress Notes (Deleted)
Office Visit Note  Patient: Stacey Craig             Date of Birth: [birth year]           MRN: [id number]             PCP: Hoyt Koch, MD Referring: Hoyt Koch, * Visit Date: 05/16/2018 Occupation: @GUAROCC @    Subjective:  No chief complaint on file.   History of Present Illness: Stacey Craig is a 57 y.o. female ***   Activities of Daily Living:  Patient reports morning stiffness for *** {minute/hour:19697}.   Patient {ACTIONS;DENIES/REPORTS:21021675::"Denies"} nocturnal pain.  Difficulty dressing/grooming: {ACTIONS;DENIES/REPORTS:21021675::"Denies"} Difficulty climbing stairs: {ACTIONS;DENIES/REPORTS:21021675::"Denies"} Difficulty getting out of chair: {ACTIONS;DENIES/REPORTS:21021675::"Denies"} Difficulty using hands for taps, buttons, cutlery, and/or writing: {ACTIONS;DENIES/REPORTS:21021675::"Denies"}   No Rheumatology ROS completed.   PMFS History:  Patient Active Problem List   Diagnosis Date Noted  . Chronic headaches 11/03/2017  . High risk medication use 09/04/2016  . Weight gain 06/21/2015  . Rheumatoid arthritis ([place]) 06/21/2015    Past Medical History:  Diagnosis Date  . Arthritis   . Colon polyps   . Genital warts   . UTI (lower urinary tract infection)     Family History  Problem Relation Age of Onset  . Cancer Father        brain, bladder, and skin  . Heart disease Father   . Stroke Father   . Cancer Maternal Grandmother        breast  . Cancer Paternal Grandmother        lung  . Cancer Paternal Grandfather        bone   Past Surgical History:  Procedure Laterality Date  . AUGMENTATION MAMMAPLASTY Bilateral   . BREAST SURGERY    . FOREARM SURGERY Left    Social History   Social History Narrative  . Not on file     Objective: Vital Signs: LMP 11/13/2011    Physical Exam   Musculoskeletal Exam: ***  CDAI Exam: No CDAI exam completed.    Investigation: No additional findings. CBC Latest  Ref Rng & Units 11/29/2017 02/22/2017 12/05/2015  WBC 3.8 - 10.8 Thousand/uL 6.4 6.0 7.1  Hemoglobin 11.7 - 15.5 g/dL 14.4 14.3 14.5  Hematocrit 35.0 - 45.0 % 40.3 41.7 41.6  Platelets 140 - 400 Thousand/uL 264 269 253   CMP Latest Ref Rng & Units 11/29/2017 02/22/2017 12/05/2015  Glucose 65 - 99 mg/dL 85 88 105(H)  BUN 7 - 25 mg/dL 10 12 8   Creatinine 0.50 - 1.05 mg/dL 0.84 0.83 0.75  Sodium 135 - 146 mmol/L 140 138 143  Potassium 3.5 - 5.3 mmol/L 3.9 4.5 3.5  Chloride 98 - 110 mmol/L 104 102 106  CO2 20 - 32 mmol/L 28 24 23   Calcium 8.6 - 10.4 mg/dL 9.9 9.4 9.4  Total Protein 6.1 - 8.1 g/dL 6.6 6.7 -  Total Bilirubin 0.2 - 1.2 mg/dL 0.5 0.7 -  Alkaline Phos 33 - 130 U/L - 35 -  AST 10 - 35 U/L 15 20 -  ALT 6 - 29 U/L 11 18 -     Imaging: No results found.  Speciality Comments: No specialty comments available.    Procedures:  No procedures performed Allergies: Patient has no known allergies.   Assessment / Plan:     Visit Diagnoses: Rheumatoid arthritis with negative rheumatoid factor, involving unspecified site (HCC)  High risk medication use - PLQ 200 mg 1 tablet by mouth daily  Primary  osteoarthritis of both knees  Chronic nonintractable headache, unspecified headache type  Madarosis of eyelid, unspecified laterality  History of colonic polyps    Orders: No orders of the defined types were placed in this encounter.  No orders of the defined types were placed in this encounter.   Face-to-face time spent with patient was *** minutes. Greater than 50% of time was spent in counseling and coordination of care.  Follow-Up Instructions: No follow-ups on file.   Ofilia Neas, PA-C  Note - This record has been created using Dragon software.  Chart creation errors have been sought, but may not always  have been located. Such creation errors do not reflect on  the standard of medical care.

## 2018-05-16 ENCOUNTER — Ambulatory Visit: Payer: No Typology Code available for payment source | Admitting: Physician Assistant

## 2018-06-07 NOTE — Progress Notes (Signed)
Office Visit Note  Patient: Stacey Craig             Date of Birth: 28-Mar-1961           MRN: [id number]             PCP: Hoyt Koch, MD Referring: Hoyt Koch, * Visit Date: 06/08/2018 Occupation: @GUAROCC @  Subjective:  Medication monitoring   History of Present Illness: Stacey Craig is a 57 y.o. female with history of seronegative rheumatoid arthritis and osteoarthritis.  Patient is on PLQ 200 mg 1 tablet daily.  She denies any recent rheumatoid arthritis flares.  She denies any joint pain or joint swelling at this time.  She denies any joint stiffness.  She states that she has been tolerating Plaquenil well and needs a refill today.  She states occasionally she uses Voltaren gel topically.  She denies any joint pain at this time.  She states that she joined a gym and has been working out has been more active lately.  She denies any concerns at this time.    Activities of Daily Living:  Patient reports morning stiffness for 0 none.   Patient Denies nocturnal pain.  Difficulty dressing/grooming: Denies Difficulty climbing stairs: Denies Difficulty getting out of chair: Denies Difficulty using hands for taps, buttons, cutlery, and/or writing: Denies  Review of Systems  Constitutional: Negative for fatigue.  HENT: Negative for mouth sores, mouth dryness and nose dryness.   Eyes: Negative for pain, visual disturbance and dryness.  Respiratory: Negative for cough, hemoptysis, shortness of breath and difficulty breathing.   Cardiovascular: Negative for chest pain, palpitations, hypertension and swelling in legs/feet.  Gastrointestinal: Negative for blood in stool, constipation and diarrhea.  Endocrine: Negative for increased urination.  Genitourinary: Negative for difficulty urinating and painful urination.  Musculoskeletal: Negative for arthralgias, joint pain, joint swelling, myalgias, muscle weakness, morning stiffness, muscle tenderness and  myalgias.  Skin: Negative for color change, pallor, rash, hair loss, nodules/bumps, skin tightness, ulcers and sensitivity to sunlight.  Allergic/Immunologic: Negative for susceptible to infections.  Neurological: Negative for dizziness, numbness, headaches and weakness.  Hematological: Negative for bruising/bleeding tendency and swollen glands.  Psychiatric/Behavioral: Negative for depressed mood and sleep disturbance. The patient is not nervous/anxious.     PMFS History:  Patient Active Problem List   Diagnosis Date Noted  . Chronic headaches 11/03/2017  . High risk medication use 09/04/2016  . Weight gain 06/21/2015  . Rheumatoid arthritis ([place]) 06/21/2015    Past Medical History:  Diagnosis Date  . Arthritis   . Colon polyps   . Genital warts   . UTI (lower urinary tract infection)     Family History  Problem Relation Age of Onset  . Cancer Father        brain, bladder, and skin  . Heart disease Father   . Stroke Father   . Cancer Maternal Grandmother        breast  . Cancer Paternal Grandmother        lung  . Cancer Paternal Grandfather        bone   Past Surgical History:  Procedure Laterality Date  . AUGMENTATION MAMMAPLASTY Bilateral   . BREAST SURGERY    . FOREARM SURGERY Left    Social History   Social History Narrative  . Not on file    Objective: Vital Signs: BP 126/84 (BP Location: Left Arm, Patient Position: Sitting, Cuff Size: Normal)   Pulse (!) 102   Resp  14   Ht 5' 3.5" (1.613 m)   Wt 135 lb 3.2 oz (61.3 kg)   LMP 11/13/2011   BMI 23.57 kg/m    Physical Exam  Constitutional: She is oriented to person, place, and time. She appears well-developed and well-nourished.  HENT:  Head: Normocephalic and atraumatic.  Eyes: Conjunctivae and EOM are normal.  Neck: Normal range of motion.  Cardiovascular: Normal rate, regular rhythm, normal heart sounds and intact distal pulses.  Pulmonary/Chest: Effort normal and breath sounds normal.    Abdominal: Soft. Bowel sounds are normal.  Lymphadenopathy:    She has no cervical adenopathy.  Neurological: She is alert and oriented to person, place, and time.  Skin: Skin is warm and dry. Capillary refill takes less than 2 seconds.  Psychiatric: She has a normal mood and affect. Her behavior is normal.  Nursing note and vitals reviewed.    Musculoskeletal Exam: C-spine, thoracic spine, lumbar spine good range of motion.  No midline spinal tenderness.  No SI joint tenderness.  Shoulder joints, elbow joints, wrist joints, MCPs, PIPs and DIPs good range of motion no synovitis.  Hip joints, knee joints, ankle joints, MTPs, PIPs, DIPs good range of motion with no synovitis.  No warmth or effusion of bilateral knee joints.  No tenderness or swelling of ankle joints.  No tenderness of trochanteric bursa bilaterally.  CDAI Exam: CDAI Score: 0  Patient Global Assessment: 0 (mm); Provider Global Assessment: 0 (mm) Swollen: 0 ; Tender: 0  Joint Exam   Not documented   There is currently no information documented on the homunculus. Go to the Rheumatology activity and complete the homunculus joint exam.  Investigation: No additional findings.  Imaging: No results found.  Recent Labs: Lab Results  Component Value Date   WBC 6.4 11/29/2017   HGB 14.4 11/29/2017   PLT 264 11/29/2017   NA 140 11/29/2017   K 3.9 11/29/2017   CL 104 11/29/2017   CO2 28 11/29/2017   GLUCOSE 85 11/29/2017   BUN 10 11/29/2017   CREATININE 0.84 11/29/2017   BILITOT 0.5 11/29/2017   ALKPHOS 35 02/22/2017   AST 15 11/29/2017   ALT 11 11/29/2017   PROT 6.6 11/29/2017   ALBUMIN 4.4 02/22/2017   CALCIUM 9.9 11/29/2017   GFRAA 91 11/29/2017    Speciality Comments: PLQ eye exam: 04/05/2018 normal. Dr. Elson Areas. Follow up in 12 months.  Procedures:  No procedures performed Allergies: Patient has no known allergies.   Assessment / Plan:     Visit Diagnoses: Rheumatoid arthritis with negative  rheumatoid factor, involving unspecified site [facility]): She has no synovitis on exam.  She has not had any recent rheumatoid arthritis flares.  She has no joint pain or joint swelling at this time.  She has no joint stiffness at this time.  She is clinically doing well on Plaquenil 200 mg 1 tablet daily.  A refill was sent to the pharmacy today.  She is advised to notify us if she develops increased joint pain or joint swelling.  She will follow-up in the office in 5 months.  High risk medication use - Plaquenil 200 mg 1 tablet daily-CBC and CMP were drawn today to monitor for drug toxicity. Plan: COMPLETE METABOLIC PANEL WITH GFR, CBC with Differential/Platelet  Primary osteoarthritis of both knees: She has no warmth or effusion of knee joints.  She has no discomfort at this time. A refill for Voltaren gel was sent to the pharmacy.   Other medical conditions are  listed as follows:   Chronic nonintractable headache, unspecified headache type  Madarosis of eyelid, unspecified laterality   Orders: Orders Placed This Encounter  Procedures  . COMPLETE METABOLIC PANEL WITH GFR  . CBC with Differential/Platelet   Meds ordered this encounter  Medications  . hydroxychloroquine (PLAQUENIL) 200 MG tablet    Sig: TAKE 1 TABLET (200 mg total) BY MOUTH DAILY    Dispense:  90 tablet    Refill:  0  . diclofenac sodium (VOLTAREN) 1 % GEL    Sig: Apply 3 grams to 3 large joints up to 3 times daily    Dispense:  3 Tube    Refill:  3     Follow-Up Instructions: Return in about 5 months (around 11/08/2018) for Rheumatoid arthritis.   Ofilia Neas, PA-C  Note - This record has been created using Dragon software.  Chart creation errors have been sought, but may not always  have been located. Such creation errors do not reflect on  the standard of medical care.

## 2018-06-08 ENCOUNTER — Encounter: Payer: Self-pay | Admitting: Physician Assistant

## 2018-06-08 ENCOUNTER — Ambulatory Visit (INDEPENDENT_AMBULATORY_CARE_PROVIDER_SITE_OTHER): Payer: No Typology Code available for payment source | Admitting: Physician Assistant

## 2018-06-08 VITALS — BP 126/84 | HR 102 | Resp 14 | Ht 63.5 in | Wt 135.2 lb

## 2018-06-08 DIAGNOSIS — R51 Headache: Secondary | ICD-10-CM

## 2018-06-08 DIAGNOSIS — H02729 Madarosis of unspecified eye, unspecified eyelid and periocular area: Secondary | ICD-10-CM

## 2018-06-08 DIAGNOSIS — M0600 Rheumatoid arthritis without rheumatoid factor, unspecified site: Secondary | ICD-10-CM

## 2018-06-08 DIAGNOSIS — M170 Bilateral primary osteoarthritis of knee: Secondary | ICD-10-CM

## 2018-06-08 DIAGNOSIS — G8929 Other chronic pain: Secondary | ICD-10-CM

## 2018-06-08 DIAGNOSIS — Z79899 Other long term (current) drug therapy: Secondary | ICD-10-CM | POA: Diagnosis not present

## 2018-06-08 MED ORDER — DICLOFENAC SODIUM 1 % TD GEL: TRANSDERMAL | 3 refills | Status: AC

## 2018-06-08 MED ORDER — HYDROXYCHLOROQUINE SULFATE 200 MG PO TABS: ORAL_TABLET | ORAL | 0 refills | Status: DC

## 2018-06-09 LAB — COMPLETE METABOLIC PANEL WITH GFR
AG RATIO: 2.9 (calc) — AB (ref 1.0–2.5)
ALKALINE PHOSPHATASE (APISO): 36 U/L (ref 33–130)
ALT: 9 U/L (ref 6–29)
AST: 14 U/L (ref 10–35)
Albumin: 4.6 g/dL (ref 3.6–5.1)
BUN: 10 mg/dL (ref 7–25)
CHLORIDE: 102 mmol/L (ref 98–110)
CO2: 25 mmol/L (ref 20–32)
Calcium: 9.1 mg/dL (ref 8.6–10.4)
Creat: 0.8 mg/dL (ref 0.50–1.05)
GFR, Est African American: 97 mL/min/{1.73_m2} (ref >=60)
GFR, Est Non African American: 84 mL/min/{1.73_m2} (ref >=60)
Globulin: 1.6 g/dL (calc) — ABNORMAL LOW (ref 1.9–3.7)
Glucose, Bld: 89 mg/dL (ref 65–99)
POTASSIUM: 4 mmol/L (ref 3.5–5.3)
Sodium: 137 mmol/L (ref 135–146)
Total Bilirubin: 0.6 mg/dL (ref 0.2–1.2)
Total Protein: 6.2 g/dL (ref 6.1–8.1)

## 2018-06-09 LAB — CBC WITH DIFFERENTIAL/PLATELET
BASOS ABS: 47 {cells}/uL (ref 0–200)
Basophils Relative: 0.6 %
EOS PCT: 2.3 %
Eosinophils Absolute: 179 cells/uL (ref 15–500)
HCT: 40.4 % (ref 35.0–45.0)
HEMOGLOBIN: 14.2 g/dL (ref 11.7–15.5)
Lymphs Abs: 2668 cells/uL (ref 850–3900)
MCH: 30.5 pg (ref 27.0–33.0)
MCHC: 35.1 g/dL (ref 32.0–36.0)
MCV: 86.9 fL (ref 80.0–100.0)
MONOS PCT: 6.3 %
MPV: 9.7 fL (ref 7.5–12.5)
NEUTROS ABS: 4415 {cells}/uL (ref 1500–7800)
Neutrophils Relative %: 56.6 %
Platelets: 276 10*3/uL (ref 140–400)
RBC: 4.65 10*6/uL (ref 3.80–5.10)
RDW: 12.1 % (ref 11.0–15.0)
Total Lymphocyte: 34.2 %
WBC mixed population: 491 cells/uL (ref 200–950)
WBC: 7.8 10*3/uL (ref 3.8–10.8)

## 2018-06-09 NOTE — Progress Notes (Signed)
Globulin is low. All other labs are WNL. We will continue to monitor labs.

## 2018-09-12 ENCOUNTER — Ambulatory Visit (INDEPENDENT_AMBULATORY_CARE_PROVIDER_SITE_OTHER): Payer: Self-pay | Admitting: Internal Medicine

## 2018-09-12 ENCOUNTER — Encounter: Payer: Self-pay | Admitting: Internal Medicine

## 2018-09-12 VITALS — BP 120/90 | HR 82 | Temp 97.7°F | Ht 63.5 in | Wt 137.0 lb

## 2018-09-12 DIAGNOSIS — M0600 Rheumatoid arthritis without rheumatoid factor, unspecified site: Secondary | ICD-10-CM

## 2018-09-12 DIAGNOSIS — R51 Headache: Secondary | ICD-10-CM

## 2018-09-12 DIAGNOSIS — Z0000 Encounter for general adult medical examination without abnormal findings: Secondary | ICD-10-CM

## 2018-09-12 DIAGNOSIS — Z23 Encounter for immunization: Secondary | ICD-10-CM

## 2018-09-12 DIAGNOSIS — R519 Headache, unspecified: Secondary | ICD-10-CM

## 2018-09-12 DIAGNOSIS — Z1159 Encounter for screening for other viral diseases: Secondary | ICD-10-CM

## 2018-09-12 NOTE — Progress Notes (Signed)
   Subjective:    Patient ID: Stacey Craig, female    DOB: 1961/10/16, 57 y.o.   MRN: [id number]  HPI The patient is a 57 YO female coming in for physical.   PMH, [facility], social history reviewed and updated.   Review of Systems  Constitutional: Negative.   HENT: Negative.   Eyes: Negative.   Respiratory: Negative for cough, chest tightness and shortness of breath.   Cardiovascular: Negative for chest pain, palpitations and leg swelling.  Gastrointestinal: Negative for abdominal distention, abdominal pain, constipation, diarrhea, nausea and vomiting.  Musculoskeletal: Negative.   Skin: Negative.   Neurological: Negative.   Psychiatric/Behavioral: Negative.       Objective:   Physical Exam  Constitutional: She is oriented to person, place, and time. She appears well-developed and well-nourished.  HENT:  Head: Normocephalic and atraumatic.  Eyes: EOM are normal.  Neck: Normal range of motion.  Cardiovascular: Normal rate and regular rhythm.  Pulmonary/Chest: Effort normal and breath sounds normal. No respiratory distress. She has no wheezes. She has no rales.  Abdominal: Soft. Bowel sounds are normal. She exhibits no distension. There is no tenderness. There is no rebound.  Musculoskeletal: She exhibits no edema.  Neurological: She is alert and oriented to person, place, and time. Coordination normal.  Skin: Skin is warm and dry.  Psychiatric: She has a normal mood and affect.   Vitals:   09/12/18 0828  BP: 120/90  Pulse: 82  Temp: 97.7 F (36.5 C)  TempSrc: Oral  SpO2: 98%  Weight: 137 lb (62.1 kg)  Height: 5' 3.5" (1.613 m)      Assessment & Plan:  Shingrix IM given at visit

## 2018-09-12 NOTE — Assessment & Plan Note (Signed)
Taking plaquenil and seeing rheum. Still with good control and yearly eye exam.

## 2018-09-12 NOTE — Assessment & Plan Note (Signed)
Flu shot declines. Shingrix 1st given today. Tetanus up to date. Colonoscopy up to date. Mammogram up to date, pap smear up to date with gyn. Counseled about sun safety and mole surveillance. Counseled about the dangers of distracted driving. Given 10 year screening recommendations.

## 2018-09-12 NOTE — Assessment & Plan Note (Signed)
Better since hormone replacement and feel related to stress. Advised to start exercising to help with stress management.

## 2018-09-12 NOTE — Patient Instructions (Signed)

## 2018-09-16 ENCOUNTER — Other Ambulatory Visit (INDEPENDENT_AMBULATORY_CARE_PROVIDER_SITE_OTHER): Payer: Self-pay

## 2018-09-16 DIAGNOSIS — Z1159 Encounter for screening for other viral diseases: Secondary | ICD-10-CM

## 2018-09-16 DIAGNOSIS — Z0000 Encounter for general adult medical examination without abnormal findings: Secondary | ICD-10-CM

## 2018-09-16 LAB — LIPID PANEL
Cholesterol: 144 mg/dL (ref 0–200)
HDL: 55.2 mg/dL (ref >39.00)
LDL Cholesterol: 74 mg/dL (ref 0–99)
NONHDL: 88.3
Total CHOL/HDL Ratio: 3
Triglycerides: 71 mg/dL (ref 0.0–149.0)
VLDL: 14.2 mg/dL (ref 0.0–40.0)

## 2018-09-16 LAB — VITAMIN B12: VITAMIN B 12: 272 pg/mL (ref 211–911)

## 2018-09-16 LAB — TSH: TSH: 0.31 u[IU]/mL — AB (ref 0.35–4.50)

## 2018-09-17 LAB — HEPATITIS C ANTIBODY
Hepatitis C Ab: NONREACTIVE
SIGNAL TO CUT-OFF: 0.03 (ref <1.00)

## 2018-09-22 ENCOUNTER — Other Ambulatory Visit: Payer: Self-pay | Admitting: Physician Assistant

## 2018-09-26 NOTE — Telephone Encounter (Signed)
ok 

## 2018-09-26 NOTE — Telephone Encounter (Signed)
Last Visit: 06/08/18 Next visit: 11/15/17 Labs: 06/08/18 Globulin is low. All other labs are WNL. We will continue to monitor labs. PLQ eye exam: 04/05/2018 normal.  Okay to refill per Dr. Estanislado Pandy

## 2018-10-06 ENCOUNTER — Other Ambulatory Visit: Payer: Self-pay | Admitting: Obstetrics and Gynecology

## 2018-10-06 DIAGNOSIS — Z1231 Encounter for screening mammogram for malignant neoplasm of breast: Secondary | ICD-10-CM

## 2018-10-10 ENCOUNTER — Ambulatory Visit
Admission: RE | Admit: 2018-10-10 | Discharge: 2018-10-10 | Disposition: A | Discharge status: Home / Self Care | Payer: No Typology Code available for payment source | Source: Ambulatory Visit

## 2018-10-10 DIAGNOSIS — Z1231 Encounter for screening mammogram for malignant neoplasm of breast: Secondary | ICD-10-CM

## 2018-11-02 NOTE — Progress Notes (Signed)
Office Visit Note  Patient: Stacey Craig             Date of Birth: 05/22/1961           MRN: [id number]             PCP: Hoyt Koch, MD Referring: Hoyt Koch, * Visit Date: 11/15/2018 Occupation: @GUAROCC @  Subjective:  Medication monitoring    History of Present Illness: Stacey Craig is a 58 y.o. female with history of seronegative rheumatoid arthritis and osteoarthritis.  She takes PLQ 200 mg 1 tablet by mouth daily. She denies any recent RA flares.  She denies any joint pain or joint swelling at this time.  She states she occasionally has some joint aches.  She denies any morning stiffness. She continues to walk for exercise.  She is planning on starting to weight lift. She denies any concerns at this time.    Activities of Daily Living:  Patient reports morning stiffness for 0 minutes.   Patient Denies nocturnal pain.  Difficulty dressing/grooming: Denies Difficulty climbing stairs: Denies Difficulty getting out of chair: Denies Difficulty using hands for taps, buttons, cutlery, and/or writing: Denies  Review of Systems  Constitutional: Negative for fatigue.  HENT: Negative for mouth sores, mouth dryness and nose dryness.   Eyes: Negative for pain, visual disturbance and dryness.  Respiratory: Negative for cough, hemoptysis, shortness of breath and difficulty breathing.   Cardiovascular: Negative for chest pain, palpitations, hypertension and swelling in legs/feet.  Gastrointestinal: Negative for blood in stool, constipation and diarrhea.  Endocrine: Negative for increased urination.  Genitourinary: Negative for painful urination.  Musculoskeletal: Negative for arthralgias, joint pain, joint swelling, myalgias, muscle weakness, morning stiffness, muscle tenderness and myalgias.  Skin: Negative for color change, pallor, rash, hair loss, nodules/bumps, skin tightness, ulcers and sensitivity to sunlight.  Allergic/Immunologic: Negative  for susceptible to infections.  Neurological: Negative for dizziness, numbness, headaches and weakness.  Hematological: Negative for swollen glands.  Psychiatric/Behavioral: Negative for depressed mood and sleep disturbance. The patient is not nervous/anxious.     PMFS History:  Patient Active Problem List   Diagnosis Date Noted  . Routine general medical examination at a health care facility 09/12/2018  . Chronic headaches 11/03/2017  . Rheumatoid arthritis ([place]) 06/21/2015    Past Medical History:  Diagnosis Date  . Arthritis   . Colon polyps   . Genital warts   . UTI (lower urinary tract infection)     Family History  Problem Relation Age of Onset  . Cancer Father        brain, bladder, and skin  . Heart disease Father   . Stroke Father   . Cancer Maternal Grandmother        breast  . Cancer Paternal Grandmother        lung  . Cancer Paternal Grandfather        bone   Past Surgical History:  Procedure Laterality Date  . AUGMENTATION MAMMAPLASTY Bilateral   . BREAST SURGERY    . FOREARM SURGERY Left    Social History   Social History Narrative  . Not on file    Objective: Vital Signs: BP 112/76 (BP Location: Right Arm, Patient Position: Sitting, Cuff Size: Small)   Pulse 69   Resp 12   Ht 5' 3.5" (1.613 m)   Wt 139 lb 6.4 oz (63.2 kg)   LMP 11/13/2011   BMI 24.31 kg/m    Physical Exam Vitals signs and nursing  note reviewed.  Constitutional:      Appearance: She is well-developed.  HENT:     Head: Normocephalic and atraumatic.  Eyes:     Conjunctiva/sclera: Conjunctivae normal.  Neck:     Musculoskeletal: Normal range of motion.  Cardiovascular:     Rate and Rhythm: Normal rate and regular rhythm.     Heart sounds: Normal heart sounds.  Pulmonary:     Effort: Pulmonary effort is normal.     Breath sounds: Normal breath sounds.  Abdominal:     General: Bowel sounds are normal.     Palpations: Abdomen is soft.  Lymphadenopathy:     Cervical:  No cervical adenopathy.  Skin:    General: Skin is warm and dry.     Capillary Refill: Capillary refill takes less than 2 seconds.  Neurological:     Mental Status: She is alert and oriented to person, place, and time.  Psychiatric:        Behavior: Behavior normal.      Musculoskeletal Exam: C-spine, thoracic spine, and lumbar spine good ROM.  No midline spinal tenderness.  No SI joint tenderness.  Shoulder joints, elbow joints, wrist joints, MCPs, PIPs and DIPs good ROM with no synovitis.  DIP synovial thickening due to osteoarthritis. Hip joints, knee joints, ankle joints, MTPs, PIPs, and DIPs good ROM with no synovitis.  No warmth or effusion of knee joints. No tenderness or swelling of ankle joint. No tenderness over trochanteric bursa bilaterally.    CDAI Exam: CDAI Score: Not documented Patient Global Assessment: 1 (mm); Provider Global Assessment: Not documented Swollen: 0 ; Tender: 0  Joint Exam   Not documented   There is currently no information documented on the homunculus. Go to the Rheumatology activity and complete the homunculus joint exam.  Investigation: No additional findings.  Imaging: No results found.  Recent Labs: Lab Results  Component Value Date   WBC 7.8 06/08/2018   HGB 14.2 06/08/2018   PLT 276 06/08/2018   NA 137 06/08/2018   K 4.0 06/08/2018   CL 102 06/08/2018   CO2 25 06/08/2018   GLUCOSE 89 06/08/2018   BUN 10 06/08/2018   CREATININE 0.80 06/08/2018   BILITOT 0.6 06/08/2018   ALKPHOS 35 02/22/2017   AST 14 06/08/2018   ALT 9 06/08/2018   PROT 6.2 06/08/2018   ALBUMIN 4.4 02/22/2017   CALCIUM 9.1 06/08/2018   GFRAA 97 06/08/2018    Speciality Comments: PLQ eye exam: 04/05/2018 normal. Dr. Elson Areas. Follow up in 12 months.  Procedures:  No procedures performed Allergies: Patient has no known allergies.      Assessment / Plan:     Visit Diagnoses: Seronegative rheumatoid arthritis ([place]): She has no synovitis on exam.  She  has not had any recent rheumatoid arthritis flares.  She has no joint pain or joint swelling at this time.  She does not experience any morning stiffness.  She is taking PLQ 200 mg 1 tablet by mouth daily.  She has not missed any doses recently.  She is clinically doing well on the current treatment regimen.  A refill of PLQ was sent to the pharmacy.  She was advised to notify us if she develops increased joint pain or joint swelling.  She will follow up in 5 months.   High risk medication use - Plaquenil 200 mg daily. Plaquenil eye exam normal on 04/05/18.  Most recent CBC/CMP within normal limits on 06/08/18. Due for CBC/CMP today and will monitor every  5 months. Standing orders placed. Patient received Shingrix in November.  - Plan: COMPLETE METABOLIC PANEL WITH GFR, CBC with Differential/Platelet  Primary osteoarthritis of both knees: She has no warmth or effusion.  She good ROM with no discomfort.   Screening for osteoporosis -An order for a DEXA was placed today. Plan: DG BONE DENSITY (DXA)  Post-menopausal - Plan: DG BONE DENSITY (DXA)   Other medical conditions are listed as follows:   Madarosis of eyelid, unspecified laterality  Chronic nonintractable headache, unspecified headache type   Orders: Orders Placed This Encounter  Procedures  . DG BONE DENSITY (DXA)  . COMPLETE METABOLIC PANEL WITH GFR  . CBC with Differential/Platelet   Meds ordered this encounter  Medications  . hydroxychloroquine (PLAQUENIL) 200 MG tablet    Sig: TAKE 1 TABLET BY MOUTH EVERY DAY    Dispense:  90 tablet    Refill:  0      Follow-Up Instructions: Return in about 5 months (around 04/16/2019) for Rheumatoid arthritis.   Ofilia Neas, PA-C  Note - This record has been created using Dragon software.  Chart creation errors have been sought, but may not always  have been located. Such creation errors do not reflect on  the standard of medical care.

## 2018-11-15 ENCOUNTER — Encounter: Payer: Self-pay | Admitting: Physician Assistant

## 2018-11-15 ENCOUNTER — Ambulatory Visit: Payer: No Typology Code available for payment source | Admitting: Physician Assistant

## 2018-11-15 VITALS — BP 112/76 | HR 69 | Resp 12 | Ht 63.5 in | Wt 139.4 lb

## 2018-11-15 DIAGNOSIS — M170 Bilateral primary osteoarthritis of knee: Secondary | ICD-10-CM

## 2018-11-15 DIAGNOSIS — H02729 Madarosis of unspecified eye, unspecified eyelid and periocular area: Secondary | ICD-10-CM

## 2018-11-15 DIAGNOSIS — M0600 Rheumatoid arthritis without rheumatoid factor, unspecified site: Secondary | ICD-10-CM | POA: Diagnosis not present

## 2018-11-15 DIAGNOSIS — G8929 Other chronic pain: Secondary | ICD-10-CM

## 2018-11-15 DIAGNOSIS — Z79899 Other long term (current) drug therapy: Secondary | ICD-10-CM

## 2018-11-15 DIAGNOSIS — Z780 Asymptomatic menopausal state: Secondary | ICD-10-CM

## 2018-11-15 DIAGNOSIS — R51 Headache: Secondary | ICD-10-CM

## 2018-11-15 DIAGNOSIS — Z13820 Encounter for screening for osteoporosis: Secondary | ICD-10-CM

## 2018-11-15 LAB — CBC WITH DIFFERENTIAL/PLATELET
Absolute Monocytes: 338 cells/uL (ref 200–950)
Basophils Absolute: 20 cells/uL (ref 0–200)
Basophils Relative: 0.4 %
Eosinophils Absolute: 142 cells/uL (ref 15–500)
Eosinophils Relative: 2.9 %
HEMATOCRIT: 38.3 % (ref 35.0–45.0)
Hemoglobin: 13.4 g/dL (ref 11.7–15.5)
Lymphs Abs: 1921 cells/uL (ref 850–3900)
MCH: 30.7 pg (ref 27.0–33.0)
MCHC: 35 g/dL (ref 32.0–36.0)
MCV: 87.6 fL (ref 80.0–100.0)
MPV: 9.6 fL (ref 7.5–12.5)
Monocytes Relative: 6.9 %
NEUTROS PCT: 50.6 %
Neutro Abs: 2479 cells/uL (ref 1500–7800)
Platelets: 266 10*3/uL (ref 140–400)
RBC: 4.37 10*6/uL (ref 3.80–5.10)
RDW: 11.6 % (ref 11.0–15.0)
Total Lymphocyte: 39.2 %
WBC: 4.9 10*3/uL (ref 3.8–10.8)

## 2018-11-15 LAB — COMPLETE METABOLIC PANEL WITH GFR
AG Ratio: 2.5 (calc) (ref 1.0–2.5)
ALT: 12 U/L (ref 6–29)
AST: 15 U/L (ref 10–35)
Albumin: 4.3 g/dL (ref 3.6–5.1)
Alkaline phosphatase (APISO): 29 U/L — ABNORMAL LOW (ref 33–130)
BILIRUBIN TOTAL: 0.5 mg/dL (ref 0.2–1.2)
BUN: 7 mg/dL (ref 7–25)
CO2: 28 mmol/L (ref 20–32)
Calcium: 9.1 mg/dL (ref 8.6–10.4)
Chloride: 105 mmol/L (ref 98–110)
Creat: 0.73 mg/dL (ref 0.50–1.05)
GFR, Est African American: 107 mL/min/{1.73_m2} (ref >=60)
GFR, Est Non African American: 93 mL/min/{1.73_m2} (ref >=60)
Globulin: 1.7 g/dL (calc) — ABNORMAL LOW (ref 1.9–3.7)
Glucose, Bld: 77 mg/dL (ref 65–99)
Potassium: 3.8 mmol/L (ref 3.5–5.3)
Sodium: 140 mmol/L (ref 135–146)
Total Protein: 6 g/dL — ABNORMAL LOW (ref 6.1–8.1)

## 2018-11-15 MED ORDER — HYDROXYCHLOROQUINE SULFATE 200 MG PO TABS: ORAL_TABLET | ORAL | 0 refills | Status: DC

## 2018-11-16 NOTE — Progress Notes (Signed)
CMP stable. CBC WNL.

## 2018-12-12 ENCOUNTER — Encounter: Payer: Self-pay | Admitting: Physician Assistant

## 2018-12-12 DIAGNOSIS — Z780 Asymptomatic menopausal state: Secondary | ICD-10-CM | POA: Diagnosis not present

## 2018-12-12 DIAGNOSIS — M8589 Other specified disorders of bone density and structure, multiple sites: Secondary | ICD-10-CM | POA: Diagnosis not present

## 2018-12-12 DIAGNOSIS — M069 Rheumatoid arthritis, unspecified: Secondary | ICD-10-CM | POA: Diagnosis not present

## 2018-12-14 ENCOUNTER — Telehealth: Payer: Self-pay

## 2018-12-14 NOTE — Telephone Encounter (Signed)
Bone Density 12/12/2018  T-score: -1.9 BMD: 0.715  Recommend calcium, vitamin D and resistive exercises.  Repeat DEXA in 2 years.   Patient advised of information and verbalized understanding.

## 2019-01-11 ENCOUNTER — Other Ambulatory Visit: Payer: Self-pay | Admitting: Rheumatology

## 2019-01-11 NOTE — Telephone Encounter (Signed)
Last Visit: 11/15/18 Next Visit due June 2020. Labs: 11/15/18 CMP stable. CBC WNL. PLQ eye exam: 04/05/2018 normal  Okay to refill per Dr. Estanislado Pandy

## 2019-01-26 DIAGNOSIS — Z1151 Encounter for screening for human papillomavirus (HPV): Secondary | ICD-10-CM | POA: Diagnosis not present

## 2019-01-26 DIAGNOSIS — Z01419 Encounter for gynecological examination (general) (routine) without abnormal findings: Secondary | ICD-10-CM | POA: Diagnosis not present

## 2019-01-26 DIAGNOSIS — Z6825 Body mass index (BMI) 25.0-25.9, adult: Secondary | ICD-10-CM | POA: Diagnosis not present

## 2019-01-26 DIAGNOSIS — Z113 Encounter for screening for infections with a predominantly sexual mode of transmission: Secondary | ICD-10-CM | POA: Diagnosis not present

## 2019-01-26 DIAGNOSIS — Z124 Encounter for screening for malignant neoplasm of cervix: Secondary | ICD-10-CM | POA: Diagnosis not present

## 2019-02-10 ENCOUNTER — Ambulatory Visit (INDEPENDENT_AMBULATORY_CARE_PROVIDER_SITE_OTHER): Payer: Self-pay | Admitting: Internal Medicine

## 2019-02-10 ENCOUNTER — Encounter: Payer: Self-pay | Admitting: Internal Medicine

## 2019-02-10 DIAGNOSIS — R0789 Other chest pain: Secondary | ICD-10-CM

## 2019-02-10 MED ORDER — PREDNISONE 20 MG PO TABS: 40.0000 mg | ORAL_TABLET | Freq: Every day | ORAL | 0 refills | Status: DC

## 2019-02-10 NOTE — Assessment & Plan Note (Signed)
Rx for prednisone 40 mg daily 5 days to see if this helps. Can use otc tylenol if needed as well.

## 2019-02-10 NOTE — Progress Notes (Signed)
Virtual Visit via Video Note  I connected with Stacey Craig on 02/10/19 at  4:00 PM EDT by a video enabled telemedicine application and verified that I am speaking with the correct person using two identifiers.   I discussed the limitations of evaluation and management by telemedicine and the availability of in person appointments. The patient expressed understanding and agreed to proceed.  History of Present Illness: The patient is a 57 y.o. female with visit for left sided chest wall pain. Started about 4 weeks ago. Has worse pain in the night time while lying flat, denies pain with walking or exercise. Stable since onset. Has tried tylenol with temporary relief. Originally thought it was a pulled muscle but does not recall an insult or overuse at onset. Denies fevers or chills or cough or SOB. Does have heartburn and it is some worse recently. Overall it is stable. Has tried tylenol. Has RA which is stable. Also has OA which is acting up some.  Observations/Objective: Appearance: normal, breathing appears normal, normal grooming, abdomen does not appear distended, throat normal, pain located inferior to the left breast along the flank, memory normal, mental status is A and O times 3  Assessment and Plan: See problem oriented charting  Follow Up Instructions: rx prednisone burst and check back in on symptoms afterwards  I discussed the assessment and treatment plan with the patient. The patient was provided an opportunity to ask questions and all were answered. The patient agreed with the plan and demonstrated an understanding of the instructions.   The patient was advised to call back or seek an in-person evaluation if the symptoms worsen or if the condition fails to improve as anticipated.  Hoyt Koch, MD

## 2019-03-04 ENCOUNTER — Encounter: Payer: Self-pay | Admitting: Internal Medicine

## 2019-03-07 MED ORDER — LIRAGLUTIDE -WEIGHT MANAGEMENT 18 MG/3ML ~~LOC~~ SOPN: 3.0000 mg | PEN_INJECTOR | Freq: Every day | SUBCUTANEOUS | 3 refills | Status: DC

## 2019-03-07 MED ORDER — LIRAGLUTIDE -WEIGHT MANAGEMENT 18 MG/3ML ~~LOC~~ SOPN: PEN_INJECTOR | SUBCUTANEOUS | 0 refills | Status: AC

## 2019-04-12 ENCOUNTER — Other Ambulatory Visit: Payer: Self-pay | Admitting: Rheumatology

## 2019-04-12 NOTE — Telephone Encounter (Signed)
Last Visit: 11/15/2018 Next Visit: message sent to the front desk to schedule.  Labs: 11/15/2018 CMP stable. CBC WNL. Eye exam:  04/05/2018  Okay to refill per Dr. Estanislado Pandy.

## 2019-05-01 NOTE — Progress Notes (Signed)
Office Visit Note  Patient: Stacey Craig             Date of Birth: Oct 09, 1961           MRN: [id number]             PCP: Hoyt Koch, MD Referring: Hoyt Koch, * Visit Date: 05/15/2019 Occupation: @GUAROCC @  Subjective:   Medication monitoring    History of Present Illness: Stacey Craig is a 58 y.o. female with history of seronegative rheumatoid arthritis and osteoarthritis.  She is taking Plaquenil 200 mg 1 tablet by mouth daily.  She has not had any recent rheumatoid arthritis flares.  She states her last flare was over 10 years ago.  She denies any joint pain or joint swelling at this time.  She denies any morning stiffness.  She denies any rheumatoid nodules.  She denies any recent infections.  She has not required the use of Voltaren gel recently.  She has no concerns at this time.   Activities of Daily Living:  Patient reports morning stiffness for  none.   Patient Denies nocturnal pain.  Difficulty dressing/grooming: Denies Difficulty climbing stairs: Denies Difficulty getting out of chair: Denies Difficulty using hands for taps, buttons, cutlery, and/or writing: Denies  Review of Systems  Constitutional: Positive for fatigue.  HENT: Negative for mouth dryness.   Eyes: Negative for dryness.  Respiratory: Negative for shortness of breath.   Cardiovascular: Negative for swelling in legs/feet.  Gastrointestinal: Negative for constipation.  Endocrine: Negative for excessive thirst.  Genitourinary: Negative for difficulty urinating.  Musculoskeletal: Negative for arthralgias and joint pain.  Skin: Negative for rash.  Allergic/Immunologic: Negative for susceptible to infections.  Neurological: Negative for weakness.  Hematological: Negative for bruising/bleeding tendency.  Psychiatric/Behavioral: Negative for sleep disturbance.    PMFS History:  Patient Active Problem List   Diagnosis Date Noted   Left-sided chest wall pain  02/10/2019   Routine general medical examination at a health care facility 09/12/2018   Chronic headaches 11/03/2017   Rheumatoid arthritis ([place]) 06/21/2015    Past Medical History:  Diagnosis Date   Arthritis    Colon polyps    Genital warts    UTI (lower urinary tract infection)     Family History  Problem Relation Age of Onset   Cancer Father        brain, bladder, and skin   Heart disease Father    Stroke Father    Cancer Maternal Grandmother        breast   Cancer Paternal Grandmother        lung   Cancer Paternal Grandfather        bone   Past Surgical History:  Procedure Laterality Date   AUGMENTATION MAMMAPLASTY Bilateral    BREAST SURGERY     FOREARM SURGERY Left    Social History   Social History Narrative   Not on file   Immunization History  Administered Date(s) Administered   Td 10/26/1990   Zoster Recombinat (Shingrix) 09/12/2018     Objective: Vital Signs: BP 117/86 (BP Location: Left Arm, Patient Position: Sitting, Cuff Size: Normal)    Pulse 90    Resp 14    Ht 5' 3.5" (1.613 m)    Wt 147 lb 12.8 oz (67 kg)    LMP 11/13/2011    BMI 25.77 kg/m    Physical Exam Vitals signs and nursing note reviewed.  Constitutional:      Appearance: She is  well-developed.  HENT:     Head: Normocephalic and atraumatic.  Eyes:     Conjunctiva/sclera: Conjunctivae normal.  Neck:     Musculoskeletal: Normal range of motion.  Cardiovascular:     Rate and Rhythm: Normal rate and regular rhythm.     Heart sounds: Normal heart sounds.  Pulmonary:     Effort: Pulmonary effort is normal.     Breath sounds: Normal breath sounds.  Abdominal:     General: Bowel sounds are normal.     Palpations: Abdomen is soft.  Lymphadenopathy:     Cervical: No cervical adenopathy.  Skin:    General: Skin is warm and dry.     Capillary Refill: Capillary refill takes less than 2 seconds.  Neurological:     Mental Status: She is alert and oriented to person,  place, and time.  Psychiatric:        Behavior: Behavior normal.      Musculoskeletal Exam: C-spine, thoracic spine, lumbar spine good range of motion.  No midline spinal tenderness.  No SI joint tenderness.  Shoulder joints, elbow joints, wrist joints, MCPs, PIPs, DIPs good range of motion with no synovitis.  She has complete fist formation bilaterally.  Hip joints, knee joints, ankle joints, MTPs, PIPs, DIPs good range of motion with no synovitis.  No warmth or effusion of bilateral knee joints.  No tenderness or swelling of ankle joints.  No tenderness over trochanteric bursa bilaterally.  CDAI Exam: CDAI Score: 0  Patient Global: 0 mm; Provider Global: 0 mm Swollen: 0 ; Tender: 0  Joint Exam   No joint exam has been documented for this visit   There is currently no information documented on the homunculus. Go to the Rheumatology activity and complete the homunculus joint exam.  Investigation: No additional findings.  Imaging: No results found.  Recent Labs: Lab Results  Component Value Date   WBC 4.9 11/15/2018   HGB 13.4 11/15/2018   PLT 266 11/15/2018   NA 140 11/15/2018   K 3.8 11/15/2018   CL 105 11/15/2018   CO2 28 11/15/2018   GLUCOSE 77 11/15/2018   BUN 7 11/15/2018   CREATININE 0.73 11/15/2018   BILITOT 0.5 11/15/2018   ALKPHOS 35 02/22/2017   AST 15 11/15/2018   ALT 12 11/15/2018   PROT 6.0 (L) 11/15/2018   ALBUMIN 4.4 02/22/2017   CALCIUM 9.1 11/15/2018   GFRAA 107 11/15/2018    Speciality Comments: PLQ eye exam: 04/05/2018 normal. Dr. Elson Areas. Follow up in 12 months.  Procedures:  No procedures performed Allergies: Patient has no known allergies.   Assessment / Plan:     Visit Diagnoses:  1. Seronegative rheumatoid arthritis ([place]): She has no synovitis on exam.  She denies any recent rheumatoid arthritis flares.  She has no joint pain or joint swelling at this time.  She has no tenderness on exam today.  According to the patient she has been  asymptomatic for the past 10 years.  She is clinically doing well on Plaquenil 200 mg 1 tablet by mouth daily.  She will continue on this current treatment regimen.  A refill Plaquenil sent to the pharmacy today.  She was advised to notify us if develops increased joint pain or joint swelling.  She will follow-up in the office in 5 months.  2. High risk medication use: Plaquenil 200 mg 1 tablet daily.  Last Plaquenil eye exam normal on 04/05/2018.  She was given a Plaquenil eye exam form today in  the office.  Most recent CBC within normal limits and CMP stable on 11/15/2018.  Due for CBC/CMP today and will monitor every 5 months.  Standing orders placed.  She received 1 dose of the Shingrix vaccine.   3. Primary osteoarthritis of both knees: She has no joint pain at this time.  She has good range of motion with no discomfort.  No warmth or effusion was noted on exam.  4. Osteopenia of left hip: DEXA on 12/12/18: total left hip BMD 0.715 with T-score of 1.9.   5. Chronic nonintractable headache, unspecified headache type   6. Madarosis of eyelid, unspecified laterality      Orders: Orders Placed This Encounter  Procedures   COMPLETE METABOLIC PANEL WITH GFR   CBC with Differential/Platelet   Meds ordered this encounter  Medications   hydroxychloroquine (PLAQUENIL) 200 MG tablet    Sig: Take 1 tablet (200 mg total) by mouth daily.    Dispense:  90 tablet    Refill:  0     Follow-Up Instructions: Return in about 5 months (around 10/15/2019) for Rheumatoid arthritis, Osteoarthritis.   Ofilia Neas, PA-C  Note - This record has been created using Dragon software.  Chart creation errors have been sought, but may not always  have been located. Such creation errors do not reflect on  the standard of medical care.

## 2019-05-15 ENCOUNTER — Telehealth: Payer: Self-pay | Admitting: Pharmacist

## 2019-05-15 ENCOUNTER — Other Ambulatory Visit: Payer: Self-pay

## 2019-05-15 ENCOUNTER — Ambulatory Visit: Payer: BC Managed Care – PPO | Admitting: Physician Assistant

## 2019-05-15 ENCOUNTER — Encounter: Payer: Self-pay | Admitting: Physician Assistant

## 2019-05-15 VITALS — BP 117/86 | HR 90 | Resp 14 | Ht 63.5 in | Wt 147.8 lb

## 2019-05-15 DIAGNOSIS — R51 Headache: Secondary | ICD-10-CM

## 2019-05-15 DIAGNOSIS — R519 Headache, unspecified: Secondary | ICD-10-CM

## 2019-05-15 DIAGNOSIS — M85852 Other specified disorders of bone density and structure, left thigh: Secondary | ICD-10-CM | POA: Diagnosis not present

## 2019-05-15 DIAGNOSIS — M170 Bilateral primary osteoarthritis of knee: Secondary | ICD-10-CM

## 2019-05-15 DIAGNOSIS — H02729 Madarosis of unspecified eye, unspecified eyelid and periocular area: Secondary | ICD-10-CM

## 2019-05-15 DIAGNOSIS — M0600 Rheumatoid arthritis without rheumatoid factor, unspecified site: Secondary | ICD-10-CM

## 2019-05-15 DIAGNOSIS — Z79899 Other long term (current) drug therapy: Secondary | ICD-10-CM | POA: Diagnosis not present

## 2019-05-15 MED ORDER — HYDROXYCHLOROQUINE SULFATE 200 MG PO TABS: 200.0000 mg | ORAL_TABLET | Freq: Every day | ORAL | 0 refills | Status: DC

## 2019-05-15 NOTE — Telephone Encounter (Signed)
Received a fax from [place] regarding a prior authorization for Plaquneil. Authorization has been APPROVED from 05/15/2019 through 05/13/2022.  Will send document to scan center.   KeyElmore Guise - Rx #: [id number]

## 2019-05-15 NOTE — Telephone Encounter (Signed)
Received a Prior Authorization request from [facility] for Plaquneil. Authorization has been submitted to patient's insurance via Cover My Meds. Will update once we receive a response.  KeyElmore Guise - Rx #: [id number]

## 2019-05-16 LAB — COMPLETE METABOLIC PANEL WITH GFR
AG Ratio: 2.4 (calc) (ref 1.0–2.5)
ALT: 9 U/L (ref 6–29)
AST: 13 U/L (ref 10–35)
Albumin: 4.4 g/dL (ref 3.6–5.1)
Alkaline phosphatase (APISO): 32 U/L — ABNORMAL LOW (ref 37–153)
BUN: 8 mg/dL (ref 7–25)
CO2: 26 mmol/L (ref 20–32)
Calcium: 9.7 mg/dL (ref 8.6–10.4)
Chloride: 106 mmol/L (ref 98–110)
Creat: 0.77 mg/dL (ref 0.50–1.05)
GFR, Est African American: 101 mL/min/{1.73_m2} (ref >=60)
GFR, Est Non African American: 87 mL/min/{1.73_m2} (ref >=60)
Globulin: 1.8 g/dL (calc) — ABNORMAL LOW (ref 1.9–3.7)
Glucose, Bld: 85 mg/dL (ref 65–99)
Potassium: 3.8 mmol/L (ref 3.5–5.3)
Sodium: 140 mmol/L (ref 135–146)
Total Bilirubin: 0.5 mg/dL (ref 0.2–1.2)
Total Protein: 6.2 g/dL (ref 6.1–8.1)

## 2019-05-16 LAB — CBC WITH DIFFERENTIAL/PLATELET
Absolute Monocytes: 448 cells/uL (ref 200–950)
Basophils Absolute: 30 cells/uL (ref 0–200)
Basophils Relative: 0.5 %
Eosinophils Absolute: 100 cells/uL (ref 15–500)
Eosinophils Relative: 1.7 %
HCT: 40.5 % (ref 35.0–45.0)
Hemoglobin: 14.1 g/dL (ref 11.7–15.5)
Lymphs Abs: 1859 cells/uL (ref 850–3900)
MCH: 31 pg (ref 27.0–33.0)
MCHC: 34.8 g/dL (ref 32.0–36.0)
MCV: 89 fL (ref 80.0–100.0)
MPV: 9.5 fL (ref 7.5–12.5)
Monocytes Relative: 7.6 %
Neutro Abs: 3463 cells/uL (ref 1500–7800)
Neutrophils Relative %: 58.7 %
Platelets: 257 10*3/uL (ref 140–400)
RBC: 4.55 10*6/uL (ref 3.80–5.10)
RDW: 11.7 % (ref 11.0–15.0)
Total Lymphocyte: 31.5 %
WBC: 5.9 10*3/uL (ref 3.8–10.8)

## 2019-05-16 NOTE — Progress Notes (Signed)
CMP stable. CBC WNL.

## 2019-05-18 ENCOUNTER — Ambulatory Visit (INDEPENDENT_AMBULATORY_CARE_PROVIDER_SITE_OTHER): Payer: BC Managed Care – PPO

## 2019-05-18 DIAGNOSIS — Z23 Encounter for immunization: Secondary | ICD-10-CM | POA: Diagnosis not present

## 2019-05-18 DIAGNOSIS — Z299 Encounter for prophylactic measures, unspecified: Secondary | ICD-10-CM

## 2019-05-20 DIAGNOSIS — S90569A Insect bite (nonvenomous), unspecified ankle, initial encounter: Secondary | ICD-10-CM | POA: Diagnosis not present

## 2019-06-05 DIAGNOSIS — Z0000 Encounter for general adult medical examination without abnormal findings: Secondary | ICD-10-CM | POA: Diagnosis not present

## 2019-06-05 DIAGNOSIS — Z6825 Body mass index (BMI) 25.0-25.9, adult: Secondary | ICD-10-CM | POA: Diagnosis not present

## 2019-06-05 DIAGNOSIS — Z131 Encounter for screening for diabetes mellitus: Secondary | ICD-10-CM | POA: Diagnosis not present

## 2019-06-05 DIAGNOSIS — Z79899 Other long term (current) drug therapy: Secondary | ICD-10-CM | POA: Diagnosis not present

## 2019-06-05 DIAGNOSIS — E663 Overweight: Secondary | ICD-10-CM | POA: Diagnosis not present

## 2019-07-17 ENCOUNTER — Other Ambulatory Visit: Payer: Self-pay | Admitting: Rheumatology

## 2019-07-17 DIAGNOSIS — M0600 Rheumatoid arthritis without rheumatoid factor, unspecified site: Secondary | ICD-10-CM

## 2019-07-17 NOTE — Telephone Encounter (Signed)
Please schedule patient for a follow up visit. Patient due December 2020. Thanks!.  

## 2019-07-17 NOTE — Telephone Encounter (Signed)
Last Visit: 05/15/19 Next Visit due December 2020. Message sent to the front to schedule.  Labs: 05/15/19 CMP stable. CBC WNL.  PLQ eye exam: 05/24/19  Normal.  Okay to refill per Dr. Estanislado Pandy

## 2019-07-18 NOTE — Telephone Encounter (Signed)
LMOM for patient to call and schedule a follow-up appointment in December.

## 2019-08-12 ENCOUNTER — Other Ambulatory Visit: Payer: Self-pay | Admitting: Physician Assistant

## 2019-08-12 DIAGNOSIS — M0600 Rheumatoid arthritis without rheumatoid factor, unspecified site: Secondary | ICD-10-CM

## 2019-10-10 ENCOUNTER — Other Ambulatory Visit: Payer: Self-pay | Admitting: Obstetrics and Gynecology

## 2019-10-10 DIAGNOSIS — Z1231 Encounter for screening mammogram for malignant neoplasm of breast: Secondary | ICD-10-CM

## 2019-10-18 ENCOUNTER — Other Ambulatory Visit: Payer: Self-pay | Admitting: Rheumatology

## 2019-10-18 DIAGNOSIS — M0600 Rheumatoid arthritis without rheumatoid factor, unspecified site: Secondary | ICD-10-CM

## 2019-10-18 NOTE — Telephone Encounter (Signed)
Last Visit: 05/15/2019 Next Visit: message sent to the front desk to schedule.  Labs: 05/15/2019 CMP stable. CBC WNL.  Eye exam: 05/24/2019  Attempted to contact patient and left message on machine to advise patient she is due to update labs (at a [facility] or [facility]). advised patient to call with preferred lab location so orders can be released.   Okay to refill 30 day supply, per Dr. Estanislado Pandy.

## 2019-11-08 ENCOUNTER — Telehealth: Payer: Self-pay | Admitting: Rheumatology

## 2019-11-08 NOTE — Telephone Encounter (Signed)
I LMOM for patient to call, and schedule a return office visit with Hazel Sams, PAC, or virtual appointment. Patient was due for follow up in Dec. 2020.

## 2019-11-08 NOTE — Telephone Encounter (Signed)
-----   Message from [place] sent at 10/18/2019  9:03 AM EST ----- Please call to schedule follow up, patient was due 09/2019. Thanks!

## 2019-11-27 ENCOUNTER — Ambulatory Visit
Admission: RE | Admit: 2019-11-27 | Discharge: 2019-11-27 | Disposition: A | Discharge status: Home / Self Care | Payer: BC Managed Care – PPO | Source: Ambulatory Visit

## 2019-11-27 ENCOUNTER — Other Ambulatory Visit: Payer: Self-pay | Admitting: *Deleted

## 2019-11-27 ENCOUNTER — Other Ambulatory Visit: Payer: Self-pay

## 2019-11-27 DIAGNOSIS — Z79899 Other long term (current) drug therapy: Secondary | ICD-10-CM

## 2019-11-27 DIAGNOSIS — Z1231 Encounter for screening mammogram for malignant neoplasm of breast: Secondary | ICD-10-CM | POA: Diagnosis not present

## 2019-11-28 LAB — CBC WITH DIFFERENTIAL/PLATELET
Basophils Absolute: 0 10*3/uL (ref 0.0–0.2)
Basos: 1 %
EOS (ABSOLUTE): 0.2 10*3/uL (ref 0.0–0.4)
Eos: 4 %
Hematocrit: 40.9 % (ref 34.0–46.6)
Hemoglobin: 14.5 g/dL (ref 11.1–15.9)
Immature Grans (Abs): 0 10*3/uL (ref 0.0–0.1)
Immature Granulocytes: 0 %
Lymphocytes Absolute: 2.2 10*3/uL (ref 0.7–3.1)
Lymphs: 46 %
MCH: 31.6 pg (ref 26.6–33.0)
MCHC: 35.5 g/dL (ref 31.5–35.7)
MCV: 89 fL (ref 79–97)
Monocytes Absolute: 0.4 10*3/uL (ref 0.1–0.9)
Monocytes: 7 %
Neutrophils Absolute: 2 10*3/uL (ref 1.4–7.0)
Neutrophils: 42 %
Platelets: 258 10*3/uL (ref 150–450)
RBC: 4.59 x10E6/uL (ref 3.77–5.28)
RDW: 11.8 % (ref 11.7–15.4)
WBC: 4.8 10*3/uL (ref 3.4–10.8)

## 2019-11-28 LAB — CMP14+EGFR
ALT: 14 IU/L (ref 0–32)
AST: 18 IU/L (ref 0–40)
Albumin/Globulin Ratio: 3.1 — ABNORMAL HIGH (ref 1.2–2.2)
Albumin: 4.7 g/dL (ref 3.8–4.9)
Alkaline Phosphatase: 39 IU/L (ref 39–117)
BUN/Creatinine Ratio: 8 — ABNORMAL LOW (ref 9–23)
BUN: 6 mg/dL (ref 6–24)
Bilirubin Total: 0.4 mg/dL (ref 0.0–1.2)
CO2: 23 mmol/L (ref 20–29)
Calcium: 9.3 mg/dL (ref 8.7–10.2)
Chloride: 103 mmol/L (ref 96–106)
Creatinine, Ser: 0.77 mg/dL (ref 0.57–1.00)
GFR calc Af Amer: 100 mL/min/{1.73_m2} (ref >59)
GFR calc non Af Amer: 87 mL/min/{1.73_m2} (ref >59)
Globulin, Total: 1.5 g/dL (ref 1.5–4.5)
Glucose: 90 mg/dL (ref 65–99)
Potassium: 4.4 mmol/L (ref 3.5–5.2)
Sodium: 139 mmol/L (ref 134–144)
Total Protein: 6.2 g/dL (ref 6.0–8.5)

## 2019-11-28 NOTE — Progress Notes (Signed)
Labs are [facility]

## 2019-11-29 ENCOUNTER — Other Ambulatory Visit: Payer: Self-pay | Admitting: Obstetrics and Gynecology

## 2019-11-29 DIAGNOSIS — R928 Other abnormal and inconclusive findings on diagnostic imaging of breast: Secondary | ICD-10-CM

## 2019-11-30 ENCOUNTER — Other Ambulatory Visit: Payer: Self-pay | Admitting: Obstetrics and Gynecology

## 2019-12-04 HISTORY — PX: BREAST LUMPECTOMY: SHX2

## 2019-12-06 ENCOUNTER — Other Ambulatory Visit: Payer: Self-pay | Admitting: Obstetrics and Gynecology

## 2019-12-06 ENCOUNTER — Ambulatory Visit
Admission: RE | Admit: 2019-12-06 | Discharge: 2019-12-06 | Disposition: A | Discharge status: Home / Self Care | Payer: BC Managed Care – PPO | Source: Ambulatory Visit | Attending: Obstetrics and Gynecology | Admitting: Obstetrics and Gynecology

## 2019-12-06 ENCOUNTER — Ambulatory Visit
Admission: RE | Admit: 2019-12-06 | Discharge: 2019-12-06 | Disposition: A | Discharge status: Home / Self Care | Payer: BC Managed Care – PPO | Source: Ambulatory Visit | Attending: Physician Assistant | Admitting: Physician Assistant

## 2019-12-06 ENCOUNTER — Other Ambulatory Visit: Payer: Self-pay

## 2019-12-06 ENCOUNTER — Other Ambulatory Visit: Payer: Self-pay | Admitting: Physician Assistant

## 2019-12-06 DIAGNOSIS — N6489 Other specified disorders of breast: Secondary | ICD-10-CM | POA: Diagnosis not present

## 2019-12-06 DIAGNOSIS — N6320 Unspecified lump in the left breast, unspecified quadrant: Secondary | ICD-10-CM

## 2019-12-06 DIAGNOSIS — R921 Mammographic calcification found on diagnostic imaging of breast: Secondary | ICD-10-CM

## 2019-12-06 DIAGNOSIS — R928 Other abnormal and inconclusive findings on diagnostic imaging of breast: Secondary | ICD-10-CM

## 2019-12-06 DIAGNOSIS — N6321 Unspecified lump in the left breast, upper outer quadrant: Secondary | ICD-10-CM | POA: Diagnosis not present

## 2019-12-07 ENCOUNTER — Other Ambulatory Visit (HOSPITAL_COMMUNITY): Payer: Self-pay | Admitting: Obstetrics and Gynecology

## 2019-12-07 DIAGNOSIS — R921 Mammographic calcification found on diagnostic imaging of breast: Secondary | ICD-10-CM

## 2019-12-07 DIAGNOSIS — N6320 Unspecified lump in the left breast, unspecified quadrant: Secondary | ICD-10-CM

## 2019-12-14 ENCOUNTER — Other Ambulatory Visit: Payer: Self-pay

## 2019-12-14 ENCOUNTER — Ambulatory Visit
Admission: RE | Admit: 2019-12-14 | Discharge: 2019-12-14 | Disposition: A | Discharge status: Home / Self Care | Payer: BC Managed Care – PPO | Source: Ambulatory Visit | Attending: Obstetrics and Gynecology | Admitting: Obstetrics and Gynecology

## 2019-12-14 ENCOUNTER — Ambulatory Visit
Admission: RE | Admit: 2019-12-14 | Discharge: 2019-12-14 | Disposition: A | Discharge status: Home / Self Care | Payer: BC Managed Care – PPO | Source: Ambulatory Visit | Attending: Physician Assistant | Admitting: Physician Assistant

## 2019-12-14 DIAGNOSIS — R921 Mammographic calcification found on diagnostic imaging of breast: Secondary | ICD-10-CM

## 2019-12-14 DIAGNOSIS — C50412 Malignant neoplasm of upper-outer quadrant of left female breast: Secondary | ICD-10-CM | POA: Diagnosis not present

## 2019-12-14 DIAGNOSIS — N6321 Unspecified lump in the left breast, upper outer quadrant: Secondary | ICD-10-CM | POA: Diagnosis not present

## 2019-12-14 DIAGNOSIS — N6320 Unspecified lump in the left breast, unspecified quadrant: Secondary | ICD-10-CM

## 2019-12-20 ENCOUNTER — Encounter: Payer: Self-pay | Admitting: Adult Health

## 2019-12-20 DIAGNOSIS — Z853 Personal history of malignant neoplasm of breast: Secondary | ICD-10-CM | POA: Insufficient documentation

## 2019-12-20 DIAGNOSIS — C50412 Malignant neoplasm of upper-outer quadrant of left female breast: Secondary | ICD-10-CM | POA: Insufficient documentation

## 2019-12-25 ENCOUNTER — Telehealth: Payer: Self-pay | Admitting: Internal Medicine

## 2019-12-25 NOTE — Telephone Encounter (Signed)
Please advise. Health maintenance tab stated that she would be due every 10 years which would 2026.

## 2019-12-25 NOTE — Telephone Encounter (Signed)
Pt has been informed and will have them send of records.

## 2019-12-25 NOTE — Telephone Encounter (Signed)
We do not have a copy of her colonoscopy records so do not know when she is due. She can fill out records release so we have this information.

## 2019-12-25 NOTE — Telephone Encounter (Signed)
New Message:   Pt is calling just see when her next Colonoscopy should be she states she was told every 5 years but no one has reached out to schedule as of yet.

## 2019-12-26 DIAGNOSIS — C50919 Malignant neoplasm of unspecified site of unspecified female breast: Secondary | ICD-10-CM | POA: Diagnosis not present

## 2019-12-26 DIAGNOSIS — R920 Mammographic microcalcification found on diagnostic imaging of breast: Secondary | ICD-10-CM | POA: Diagnosis not present

## 2019-12-26 DIAGNOSIS — C50412 Malignant neoplasm of upper-outer quadrant of left female breast: Secondary | ICD-10-CM | POA: Diagnosis not present

## 2019-12-26 DIAGNOSIS — Z170 Estrogen receptor positive status [ER+]: Secondary | ICD-10-CM | POA: Diagnosis not present

## 2020-01-09 DIAGNOSIS — C50919 Malignant neoplasm of unspecified site of unspecified female breast: Secondary | ICD-10-CM | POA: Diagnosis not present

## 2020-01-09 DIAGNOSIS — C50812 Malignant neoplasm of overlapping sites of left female breast: Secondary | ICD-10-CM | POA: Diagnosis not present

## 2020-01-13 DIAGNOSIS — Z20828 Contact with and (suspected) exposure to other viral communicable diseases: Secondary | ICD-10-CM | POA: Diagnosis not present

## 2020-01-13 DIAGNOSIS — Z20822 Contact with and (suspected) exposure to covid-19: Secondary | ICD-10-CM | POA: Diagnosis not present

## 2020-01-19 DIAGNOSIS — N641 Fat necrosis of breast: Secondary | ICD-10-CM | POA: Diagnosis not present

## 2020-01-19 DIAGNOSIS — Z170 Estrogen receptor positive status [ER+]: Secondary | ICD-10-CM | POA: Diagnosis not present

## 2020-01-19 DIAGNOSIS — C50412 Malignant neoplasm of upper-outer quadrant of left female breast: Secondary | ICD-10-CM | POA: Diagnosis not present

## 2020-01-19 DIAGNOSIS — D360 Benign neoplasm of lymph nodes: Secondary | ICD-10-CM | POA: Diagnosis not present

## 2020-01-19 DIAGNOSIS — C50912 Malignant neoplasm of unspecified site of left female breast: Secondary | ICD-10-CM | POA: Diagnosis not present

## 2020-01-19 DIAGNOSIS — D0512 Intraductal carcinoma in situ of left breast: Secondary | ICD-10-CM | POA: Diagnosis not present

## 2020-01-19 DIAGNOSIS — N6032 Fibrosclerosis of left breast: Secondary | ICD-10-CM | POA: Diagnosis not present

## 2020-01-30 DIAGNOSIS — Z01419 Encounter for gynecological examination (general) (routine) without abnormal findings: Secondary | ICD-10-CM | POA: Diagnosis not present

## 2020-01-30 DIAGNOSIS — Z6826 Body mass index (BMI) 26.0-26.9, adult: Secondary | ICD-10-CM | POA: Diagnosis not present

## 2020-02-12 ENCOUNTER — Encounter: Payer: Self-pay | Admitting: Internal Medicine

## 2020-02-14 ENCOUNTER — Telehealth: Payer: Self-pay

## 2020-02-14 NOTE — Telephone Encounter (Signed)
New message   The patient calling   Need a referral: GI specialist   Reason: Colonoscopy due to  Polyps Removed remove 5 years ago   Facility: No preference

## 2020-02-14 NOTE — Telephone Encounter (Signed)
Please advise 

## 2020-02-15 ENCOUNTER — Ambulatory Visit (INDEPENDENT_AMBULATORY_CARE_PROVIDER_SITE_OTHER): Payer: BC Managed Care – PPO | Admitting: Internal Medicine

## 2020-02-15 ENCOUNTER — Other Ambulatory Visit: Payer: Self-pay

## 2020-02-15 ENCOUNTER — Encounter: Payer: Self-pay | Admitting: Internal Medicine

## 2020-02-15 VITALS — BP 138/88 | HR 84 | Temp 99.0°F | Ht 63.5 in | Wt 154.0 lb

## 2020-02-15 DIAGNOSIS — Z170 Estrogen receptor positive status [ER+]: Secondary | ICD-10-CM | POA: Diagnosis not present

## 2020-02-15 DIAGNOSIS — C50412 Malignant neoplasm of upper-outer quadrant of left female breast: Secondary | ICD-10-CM | POA: Diagnosis not present

## 2020-02-15 DIAGNOSIS — K219 Gastro-esophageal reflux disease without esophagitis: Secondary | ICD-10-CM | POA: Diagnosis not present

## 2020-02-15 DIAGNOSIS — M0600 Rheumatoid arthritis without rheumatoid factor, unspecified site: Secondary | ICD-10-CM | POA: Diagnosis not present

## 2020-02-15 NOTE — Telephone Encounter (Signed)
She should be able to call the place that did her prior colonoscopy to just schedule this. If she wishes to change locations resend this to me and I can place referral.

## 2020-02-15 NOTE — Assessment & Plan Note (Signed)
Continue omeprazole and add pepcid in the evening. If no relief can switch omeprazole to a different PPI. We talked about not lying down after eating for several hours which can help.

## 2020-02-15 NOTE — Assessment & Plan Note (Signed)
Will be getting radiation and then anti-estrogen therapy. She has stopped hormone replacement without significant side effects.

## 2020-02-15 NOTE — Assessment & Plan Note (Signed)
Stable on plaquenil and up to date on eye exam.

## 2020-02-15 NOTE — Progress Notes (Signed)
   Subjective:   Patient ID: Stacey Craig, female    DOB: 29-Aug-1961, 59 y.o.   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  Review of Systems  Constitutional: Negative.   HENT: Negative.   Eyes: Negative.   Respiratory: Negative for cough, chest tightness and shortness of breath.   Cardiovascular: Negative for chest pain, palpitations and leg swelling.  Gastrointestinal: Negative for abdominal distention, abdominal pain, constipation, diarrhea, nausea and vomiting.       Gerd  Musculoskeletal: Negative.   Skin: Negative.   Neurological: Negative.   Psychiatric/Behavioral: Negative.     Objective:  Physical Exam Constitutional:      Appearance: She is well-developed.  HENT:     Head: Normocephalic and atraumatic.  Cardiovascular:     Rate and Rhythm: Normal rate and regular rhythm.  Pulmonary:     Effort: Pulmonary effort is normal. No respiratory distress.     Breath sounds: Normal breath sounds. No wheezing or rales.  Abdominal:     General: Bowel sounds are normal. There is no distension.     Palpations: Abdomen is soft.     Tenderness: There is no abdominal tenderness. There is no rebound.  Musculoskeletal:     Cervical back: Normal range of motion.  Skin:    General: Skin is warm and dry.  Neurological:     Mental Status: She is alert and oriented to  person, place, and time.     Coordination: Coordination normal.     Vitals:   02/15/20 0907  BP: 138/88  Pulse: 84  Temp: 99 F (37.2 C)  SpO2: 99%  Weight: 154 lb (69.9 kg)  Height: 5' 3.5" (1.613 m)    This visit occurred during the SARS-CoV-2 public health emergency.  Safety protocols were in place, including screening questions prior to the visit, additional usage of staff PPE, and extensive cleaning of exam room while observing appropriate contact time as indicated for disinfecting solutions.   Assessment & Plan:  Visit time 20 minutes in face to face communication with patient and coordination of care, additional 10 minutes spent in record review, coordination or care, ordering tests, communicating/referring to other healthcare professionals, documenting in medical records all on the same day of the visit for total time 30 minutes spent on the visit.

## 2020-02-15 NOTE — Patient Instructions (Signed)
You can add pepcid (famotidine) in the evening to help with the symptoms.   If this doesn't help let us know we can try changing the omeprazole.   Biotin is for hair and nail strength over the counter.   Food Choices for Gastroesophageal Reflux Disease, Adult When you have gastroesophageal reflux disease (GERD), the foods you eat and your eating habits are very important. Choosing the right foods can help ease your discomfort. Think about working with a nutrition specialist (dietitian) to help you make good choices. What are tips for following this plan?  Meals  Choose healthy foods that are low in fat, such as fruits, vegetables, whole grains, low-fat dairy products, and lean meat, fish, and poultry.  Eat small meals often instead of 3 large meals a day. Eat your meals slowly, and in a place where you are relaxed. Avoid bending over or lying down until 2-3 hours after eating.  Avoid eating meals 2-3 hours before bed.  Avoid drinking a lot of liquid with meals.  Cook foods using methods other than frying. Bake, grill, or broil food instead.  Avoid or limit: ? Chocolate. ? Peppermint or spearmint. ? Alcohol. ? Pepper. ? Black and decaffeinated coffee. ? Black and decaffeinated tea. ? Bubbly (carbonated) soft drinks. ? Caffeinated energy drinks and soft drinks.  Limit high-fat foods such as: ? Fatty meat or fried foods. ? Whole milk, cream, butter, or ice cream. ? Nuts and nut butters. ? Pastries, donuts, and sweets made with butter or shortening.  Avoid foods that cause symptoms. These foods may be different for everyone. Common foods that cause symptoms include: ? Tomatoes. ? Oranges, lemons, and limes. ? Peppers. ? Spicy food. ? Onions and garlic. ? Vinegar. Lifestyle  Maintain a healthy weight. Ask your doctor what weight is healthy for you. If you need to lose weight, work with your doctor to do so safely.  Exercise for at least 30 minutes for 5 or more days each  week, or as told by your doctor.  Wear loose-fitting clothes.  Do not smoke. If you need help quitting, ask your doctor.  Sleep with the head of your bed higher than your feet. Use a wedge under the mattress or blocks under the bed frame to raise the head of the bed. Summary  When you have gastroesophageal reflux disease (GERD), food and lifestyle choices are very important in easing your symptoms.  Eat small meals often instead of 3 large meals a day. Eat your meals slowly, and in a place where you are relaxed.  Limit high-fat foods such as fatty meat or fried foods.  Avoid bending over or lying down until 2-3 hours after eating.  Avoid peppermint and spearmint, caffeine, alcohol, and chocolate. This information is not intended to replace advice given to you by your health care provider. Make sure you discuss any questions you have with your health care provider. Document Revised: 02/02/2019 Document Reviewed: 11/17/2016 Elsevier Patient Education  [place].

## 2020-02-16 NOTE — Telephone Encounter (Signed)
Called patient informed of message.  Nothing further is needed

## 2020-02-20 DIAGNOSIS — Z510 Encounter for antineoplastic radiation therapy: Secondary | ICD-10-CM | POA: Diagnosis not present

## 2020-02-20 DIAGNOSIS — Z9012 Acquired absence of left breast and nipple: Secondary | ICD-10-CM | POA: Diagnosis not present

## 2020-02-20 DIAGNOSIS — C50412 Malignant neoplasm of upper-outer quadrant of left female breast: Secondary | ICD-10-CM | POA: Diagnosis not present

## 2020-02-20 DIAGNOSIS — Z170 Estrogen receptor positive status [ER+]: Secondary | ICD-10-CM | POA: Diagnosis not present

## 2020-02-23 DIAGNOSIS — Z510 Encounter for antineoplastic radiation therapy: Secondary | ICD-10-CM | POA: Diagnosis not present

## 2020-02-23 DIAGNOSIS — Z170 Estrogen receptor positive status [ER+]: Secondary | ICD-10-CM | POA: Diagnosis not present

## 2020-02-23 DIAGNOSIS — Z9012 Acquired absence of left breast and nipple: Secondary | ICD-10-CM | POA: Diagnosis not present

## 2020-02-23 DIAGNOSIS — C50412 Malignant neoplasm of upper-outer quadrant of left female breast: Secondary | ICD-10-CM | POA: Diagnosis not present

## 2020-03-05 DIAGNOSIS — K7689 Other specified diseases of liver: Secondary | ICD-10-CM | POA: Diagnosis not present

## 2020-03-05 DIAGNOSIS — Z170 Estrogen receptor positive status [ER+]: Secondary | ICD-10-CM | POA: Diagnosis not present

## 2020-03-05 DIAGNOSIS — C50412 Malignant neoplasm of upper-outer quadrant of left female breast: Secondary | ICD-10-CM | POA: Diagnosis not present

## 2020-03-11 DIAGNOSIS — Z170 Estrogen receptor positive status [ER+]: Secondary | ICD-10-CM | POA: Diagnosis not present

## 2020-03-11 DIAGNOSIS — Z510 Encounter for antineoplastic radiation therapy: Secondary | ICD-10-CM | POA: Diagnosis not present

## 2020-03-11 DIAGNOSIS — C50412 Malignant neoplasm of upper-outer quadrant of left female breast: Secondary | ICD-10-CM | POA: Diagnosis not present

## 2020-03-11 DIAGNOSIS — Z9012 Acquired absence of left breast and nipple: Secondary | ICD-10-CM | POA: Diagnosis not present

## 2020-03-12 DIAGNOSIS — Z510 Encounter for antineoplastic radiation therapy: Secondary | ICD-10-CM | POA: Diagnosis not present

## 2020-03-12 DIAGNOSIS — Z170 Estrogen receptor positive status [ER+]: Secondary | ICD-10-CM | POA: Diagnosis not present

## 2020-03-12 DIAGNOSIS — Z9012 Acquired absence of left breast and nipple: Secondary | ICD-10-CM | POA: Diagnosis not present

## 2020-03-12 DIAGNOSIS — C50412 Malignant neoplasm of upper-outer quadrant of left female breast: Secondary | ICD-10-CM | POA: Diagnosis not present

## 2020-03-13 DIAGNOSIS — C50412 Malignant neoplasm of upper-outer quadrant of left female breast: Secondary | ICD-10-CM | POA: Diagnosis not present

## 2020-03-13 DIAGNOSIS — Z510 Encounter for antineoplastic radiation therapy: Secondary | ICD-10-CM | POA: Diagnosis not present

## 2020-03-13 DIAGNOSIS — Z170 Estrogen receptor positive status [ER+]: Secondary | ICD-10-CM | POA: Diagnosis not present

## 2020-03-13 DIAGNOSIS — Z9012 Acquired absence of left breast and nipple: Secondary | ICD-10-CM | POA: Diagnosis not present

## 2020-03-14 DIAGNOSIS — Z510 Encounter for antineoplastic radiation therapy: Secondary | ICD-10-CM | POA: Diagnosis not present

## 2020-03-14 DIAGNOSIS — C50412 Malignant neoplasm of upper-outer quadrant of left female breast: Secondary | ICD-10-CM | POA: Diagnosis not present

## 2020-03-14 DIAGNOSIS — Z170 Estrogen receptor positive status [ER+]: Secondary | ICD-10-CM | POA: Diagnosis not present

## 2020-03-14 DIAGNOSIS — Z9012 Acquired absence of left breast and nipple: Secondary | ICD-10-CM | POA: Diagnosis not present

## 2020-03-15 DIAGNOSIS — C50412 Malignant neoplasm of upper-outer quadrant of left female breast: Secondary | ICD-10-CM | POA: Diagnosis not present

## 2020-03-15 DIAGNOSIS — Z9012 Acquired absence of left breast and nipple: Secondary | ICD-10-CM | POA: Diagnosis not present

## 2020-03-15 DIAGNOSIS — Z510 Encounter for antineoplastic radiation therapy: Secondary | ICD-10-CM | POA: Diagnosis not present

## 2020-03-15 DIAGNOSIS — Z170 Estrogen receptor positive status [ER+]: Secondary | ICD-10-CM | POA: Diagnosis not present

## 2020-03-18 DIAGNOSIS — Z9012 Acquired absence of left breast and nipple: Secondary | ICD-10-CM | POA: Diagnosis not present

## 2020-03-18 DIAGNOSIS — C50412 Malignant neoplasm of upper-outer quadrant of left female breast: Secondary | ICD-10-CM | POA: Diagnosis not present

## 2020-03-18 DIAGNOSIS — Z510 Encounter for antineoplastic radiation therapy: Secondary | ICD-10-CM | POA: Diagnosis not present

## 2020-03-18 DIAGNOSIS — Z170 Estrogen receptor positive status [ER+]: Secondary | ICD-10-CM | POA: Diagnosis not present

## 2020-03-19 DIAGNOSIS — Z510 Encounter for antineoplastic radiation therapy: Secondary | ICD-10-CM | POA: Diagnosis not present

## 2020-03-19 DIAGNOSIS — C50412 Malignant neoplasm of upper-outer quadrant of left female breast: Secondary | ICD-10-CM | POA: Diagnosis not present

## 2020-03-19 DIAGNOSIS — Z170 Estrogen receptor positive status [ER+]: Secondary | ICD-10-CM | POA: Diagnosis not present

## 2020-03-19 DIAGNOSIS — Z9012 Acquired absence of left breast and nipple: Secondary | ICD-10-CM | POA: Diagnosis not present

## 2020-03-20 DIAGNOSIS — Z9012 Acquired absence of left breast and nipple: Secondary | ICD-10-CM | POA: Diagnosis not present

## 2020-03-20 DIAGNOSIS — C50412 Malignant neoplasm of upper-outer quadrant of left female breast: Secondary | ICD-10-CM | POA: Diagnosis not present

## 2020-03-20 DIAGNOSIS — Z170 Estrogen receptor positive status [ER+]: Secondary | ICD-10-CM | POA: Diagnosis not present

## 2020-03-20 DIAGNOSIS — Z510 Encounter for antineoplastic radiation therapy: Secondary | ICD-10-CM | POA: Diagnosis not present

## 2020-03-21 DIAGNOSIS — Z9012 Acquired absence of left breast and nipple: Secondary | ICD-10-CM | POA: Diagnosis not present

## 2020-03-21 DIAGNOSIS — Z510 Encounter for antineoplastic radiation therapy: Secondary | ICD-10-CM | POA: Diagnosis not present

## 2020-03-21 DIAGNOSIS — Z170 Estrogen receptor positive status [ER+]: Secondary | ICD-10-CM | POA: Diagnosis not present

## 2020-03-21 DIAGNOSIS — C50412 Malignant neoplasm of upper-outer quadrant of left female breast: Secondary | ICD-10-CM | POA: Diagnosis not present

## 2020-03-22 DIAGNOSIS — Z9012 Acquired absence of left breast and nipple: Secondary | ICD-10-CM | POA: Diagnosis not present

## 2020-03-22 DIAGNOSIS — Z170 Estrogen receptor positive status [ER+]: Secondary | ICD-10-CM | POA: Diagnosis not present

## 2020-03-22 DIAGNOSIS — Z510 Encounter for antineoplastic radiation therapy: Secondary | ICD-10-CM | POA: Diagnosis not present

## 2020-03-22 DIAGNOSIS — C50412 Malignant neoplasm of upper-outer quadrant of left female breast: Secondary | ICD-10-CM | POA: Diagnosis not present

## 2020-03-26 DIAGNOSIS — Z510 Encounter for antineoplastic radiation therapy: Secondary | ICD-10-CM | POA: Diagnosis not present

## 2020-03-26 DIAGNOSIS — Z170 Estrogen receptor positive status [ER+]: Secondary | ICD-10-CM | POA: Diagnosis not present

## 2020-03-26 DIAGNOSIS — C50412 Malignant neoplasm of upper-outer quadrant of left female breast: Secondary | ICD-10-CM | POA: Diagnosis not present

## 2020-03-26 DIAGNOSIS — Z9012 Acquired absence of left breast and nipple: Secondary | ICD-10-CM | POA: Diagnosis not present

## 2020-03-26 HISTORY — PX: SKIN SURGERY: SHX2413

## 2020-03-27 DIAGNOSIS — Z510 Encounter for antineoplastic radiation therapy: Secondary | ICD-10-CM | POA: Diagnosis not present

## 2020-03-27 DIAGNOSIS — C50412 Malignant neoplasm of upper-outer quadrant of left female breast: Secondary | ICD-10-CM | POA: Diagnosis not present

## 2020-03-27 DIAGNOSIS — Z9012 Acquired absence of left breast and nipple: Secondary | ICD-10-CM | POA: Diagnosis not present

## 2020-03-27 DIAGNOSIS — Z170 Estrogen receptor positive status [ER+]: Secondary | ICD-10-CM | POA: Diagnosis not present

## 2020-03-28 DIAGNOSIS — Z510 Encounter for antineoplastic radiation therapy: Secondary | ICD-10-CM | POA: Diagnosis not present

## 2020-03-28 DIAGNOSIS — C50412 Malignant neoplasm of upper-outer quadrant of left female breast: Secondary | ICD-10-CM | POA: Diagnosis not present

## 2020-03-28 DIAGNOSIS — Z170 Estrogen receptor positive status [ER+]: Secondary | ICD-10-CM | POA: Diagnosis not present

## 2020-03-28 DIAGNOSIS — Z9012 Acquired absence of left breast and nipple: Secondary | ICD-10-CM | POA: Diagnosis not present

## 2020-03-29 DIAGNOSIS — C50412 Malignant neoplasm of upper-outer quadrant of left female breast: Secondary | ICD-10-CM | POA: Diagnosis not present

## 2020-03-29 DIAGNOSIS — Z9012 Acquired absence of left breast and nipple: Secondary | ICD-10-CM | POA: Diagnosis not present

## 2020-03-29 DIAGNOSIS — Z170 Estrogen receptor positive status [ER+]: Secondary | ICD-10-CM | POA: Diagnosis not present

## 2020-03-29 DIAGNOSIS — Z510 Encounter for antineoplastic radiation therapy: Secondary | ICD-10-CM | POA: Diagnosis not present

## 2020-04-01 DIAGNOSIS — Z9012 Acquired absence of left breast and nipple: Secondary | ICD-10-CM | POA: Diagnosis not present

## 2020-04-01 DIAGNOSIS — C50412 Malignant neoplasm of upper-outer quadrant of left female breast: Secondary | ICD-10-CM | POA: Diagnosis not present

## 2020-04-01 DIAGNOSIS — Z170 Estrogen receptor positive status [ER+]: Secondary | ICD-10-CM | POA: Diagnosis not present

## 2020-04-01 DIAGNOSIS — Z510 Encounter for antineoplastic radiation therapy: Secondary | ICD-10-CM | POA: Diagnosis not present

## 2020-04-02 DIAGNOSIS — Z510 Encounter for antineoplastic radiation therapy: Secondary | ICD-10-CM | POA: Diagnosis not present

## 2020-04-02 DIAGNOSIS — C50412 Malignant neoplasm of upper-outer quadrant of left female breast: Secondary | ICD-10-CM | POA: Diagnosis not present

## 2020-04-02 DIAGNOSIS — Z9012 Acquired absence of left breast and nipple: Secondary | ICD-10-CM | POA: Diagnosis not present

## 2020-04-02 DIAGNOSIS — Z170 Estrogen receptor positive status [ER+]: Secondary | ICD-10-CM | POA: Diagnosis not present

## 2020-04-05 DIAGNOSIS — L821 Other seborrheic keratosis: Secondary | ICD-10-CM | POA: Diagnosis not present

## 2020-04-05 DIAGNOSIS — Z1283 Encounter for screening for malignant neoplasm of skin: Secondary | ICD-10-CM | POA: Diagnosis not present

## 2020-04-05 DIAGNOSIS — D485 Neoplasm of uncertain behavior of skin: Secondary | ICD-10-CM | POA: Diagnosis not present

## 2020-04-05 DIAGNOSIS — D225 Melanocytic nevi of trunk: Secondary | ICD-10-CM | POA: Diagnosis not present

## 2020-04-08 ENCOUNTER — Encounter: Payer: Self-pay | Admitting: Internal Medicine

## 2020-04-08 DIAGNOSIS — Z820 Family history of epilepsy and other diseases of the nervous system: Secondary | ICD-10-CM

## 2020-04-15 NOTE — Progress Notes (Deleted)
Office Visit Note  Patient: Stacey Craig             Date of Birth: 1961/08/17           MRN: [id number]             PCP: Hoyt Koch, MD Referring: Hoyt Koch, * Visit Date: 04/16/2020 Occupation: @GUAROCC @  Subjective:  No chief complaint on file.   History of Present Illness: Stacey Craig is a 59 y.o. female ***   Activities of Daily Living:  Patient reports morning stiffness for *** {minute/hour:19697}.   Patient {ACTIONS;DENIES/REPORTS:21021675::"Denies"} nocturnal pain.  Difficulty dressing/grooming: {ACTIONS;DENIES/REPORTS:21021675::"Denies"} Difficulty climbing stairs: {ACTIONS;DENIES/REPORTS:21021675::"Denies"} Difficulty getting out of chair: {ACTIONS;DENIES/REPORTS:21021675::"Denies"} Difficulty using hands for taps, buttons, cutlery, and/or writing: {ACTIONS;DENIES/REPORTS:21021675::"Denies"}  No Rheumatology ROS completed.   PMFS History:  Patient Active Problem List   Diagnosis Date Noted  . GERD (gastroesophageal reflux disease) 02/15/2020  . Malignant neoplasm of upper-outer quadrant of left breast in female, estrogen receptor positive ([place]) 12/20/2019  . Routine general medical examination at a health care facility 09/12/2018  . Chronic headaches 11/03/2017  . Rheumatoid arthritis ([place]) 06/21/2015    Past Medical History:  Diagnosis Date  . Arthritis   . Colon polyps   . Genital warts   . UTI (lower urinary tract infection)     Family History  Problem Relation Age of Onset  . Cancer Father        brain, bladder, and skin  . Heart disease Father   . Stroke Father   . Cancer Maternal Grandmother        breast  . Cancer Paternal Grandmother        lung  . Cancer Paternal Grandfather        bone   Past Surgical History:  Procedure Laterality Date  . AUGMENTATION MAMMAPLASTY Bilateral   . BREAST SURGERY    . FOREARM SURGERY Left    Social History   Social History Narrative  . Not on file    Immunization History  Administered Date(s) Administered  . PFIZER SARS-COV-2 Vaccination 01/22/2020, 02/07/2020  . Td 10/26/1990  . Zoster Recombinat (Shingrix) 09/12/2018, 05/18/2019     Objective: Vital Signs: LMP 11/13/2011    Physical Exam   Musculoskeletal Exam: ***  CDAI Exam: CDAI Score: -- Patient Global: --; Provider Global: -- Swollen: --; Tender: -- Joint Exam 04/16/2020   No joint exam has been documented for this visit   There is currently no information documented on the homunculus. Go to the Rheumatology activity and complete the homunculus joint exam.  Investigation: No additional findings.  Imaging: No results found.  Recent Labs: Lab Results  Component Value Date   WBC 4.8 11/27/2019   HGB 14.5 11/27/2019   PLT 258 11/27/2019   NA 139 11/27/2019   K 4.4 11/27/2019   CL 103 11/27/2019   CO2 23 11/27/2019   GLUCOSE 90 11/27/2019   BUN 6 11/27/2019   CREATININE 0.77 11/27/2019   BILITOT 0.4 11/27/2019   ALKPHOS 39 11/27/2019   AST 18 11/27/2019   ALT 14 11/27/2019   PROT 6.2 11/27/2019   ALBUMIN 4.7 11/27/2019   CALCIUM 9.3 11/27/2019   GFRAA 100 11/27/2019    Speciality Comments: PLQ eye exam: 05/24/19  normal. Dr. Elson Areas. Follow up in 12 months.  Procedures:  No procedures performed Allergies: Patient has no known allergies.   Assessment / Plan:     Visit Diagnoses: No diagnosis found.  Orders: No  orders of the defined types were placed in this encounter.  No orders of the defined types were placed in this encounter.   Face-to-face time spent with patient was *** minutes. Greater than 50% of time was spent in counseling and coordination of care.  Follow-Up Instructions: No follow-ups on file.   Earnestine Mealing, CMA  Note - This record has been created using Editor, commissioning.  Chart creation errors have been sought, but may not always  have been located. Such creation errors do not reflect on  the standard of  medical care.

## 2020-04-16 ENCOUNTER — Ambulatory Visit: Payer: BC Managed Care – PPO | Admitting: Rheumatology

## 2020-04-16 NOTE — Progress Notes (Signed)
Office Visit Note  Patient: Stacey Craig             Date of Birth: 1961-01-20           MRN: [id number]             PCP: Hoyt Koch, MD Referring: Hoyt Koch, * Visit Date: 04/17/2020 Occupation: @GUAROCC @  Subjective:  Medication Management   History of Present Illness: Teosha Casso is a 59 y.o. female with history of seronegative rheumatoid arthritis and osteoarthritis.  She is taking Plaquenil 200 mg 1 tablet by mouth daily Denies any side effects or recent infection.  She has not had any recent rheumatoid arthritis flares. Denies any joint pain or swelling. She has an appointment for plaquenil eye exam in September. She has no concerns at this time.  Activities of Daily Living:  Patient reports morning stiffness for 0 minutes.   Patient Denies nocturnal pain.  Difficulty dressing/grooming: Denies Difficulty climbing stairs: Denies Difficulty getting out of chair: Denies Difficulty using hands for taps, buttons, cutlery, and/or writing: Denies  Review of Systems  Constitutional: Negative for fatigue.  HENT: Negative for mouth sores, mouth dryness and nose dryness.   Eyes: Negative for itching and dryness.  Respiratory: Negative for shortness of breath and difficulty breathing.   Cardiovascular: Negative for chest pain and palpitations.  Gastrointestinal: Negative for blood in stool, constipation and diarrhea.  Endocrine: Negative for increased urination.  Genitourinary: Negative for difficulty urinating and painful urination.  Musculoskeletal: Negative for arthralgias, joint pain, joint swelling, myalgias, morning stiffness, muscle tenderness and myalgias.  Skin: Negative for color change, rash and redness.  Allergic/Immunologic: Negative for susceptible to infections.  Neurological: Negative for dizziness, numbness, headaches, memory loss and weakness.  Hematological: Negative for bruising/bleeding tendency.    Psychiatric/Behavioral: Negative for confusion.    PMFS History:  Patient Active Problem List   Diagnosis Date Noted  . GERD (gastroesophageal reflux disease) 02/15/2020  . Malignant neoplasm of upper-outer quadrant of left breast in female, estrogen receptor positive ([place]) 12/20/2019  . Routine general medical examination at a health care facility 09/12/2018  . Chronic headaches 11/03/2017  . Rheumatoid arthritis ([place]) 06/21/2015    Past Medical History:  Diagnosis Date  . Arthritis   . Colon polyps   . Genital warts   . UTI (lower urinary tract infection)     Family History  Problem Relation Age of Onset  . Cancer Father        brain, bladder, and skin  . Heart disease Father   . Stroke Father   . Cancer Maternal Grandmother        breast  . Cancer Paternal Grandmother        lung  . Cancer Paternal Grandfather        bone   Past Surgical History:  Procedure Laterality Date  . AUGMENTATION MAMMAPLASTY Bilateral   . BREAST SURGERY    . FOREARM SURGERY Left   . SKIN SURGERY  03/2020   chest, back    Social History   Social History Narrative  . Not on file   Immunization History  Administered Date(s) Administered  . PFIZER SARS-COV-2 Vaccination 01/22/2020, 02/07/2020  . Td 10/26/1990  . Zoster Recombinat (Shingrix) 09/12/2018, 05/18/2019     Objective: Vital Signs: BP 116/82 (BP Location: Left Arm, Patient Position: Sitting, Cuff Size: Normal)   Pulse 66   Resp 13   Ht 5' 3.5" (1.613 m)   Wt 149 lb  3.2 oz (67.7 kg)   LMP 11/13/2011   BMI 26.02 kg/m    Physical Exam Vitals and nursing note reviewed.  Constitutional:      Appearance: She is well-developed.  HENT:     Head: Normocephalic and atraumatic.  Eyes:     Conjunctiva/sclera: Conjunctivae normal.  Cardiovascular:     Rate and Rhythm: Normal rate and regular rhythm.     Heart sounds: Normal heart sounds.  Pulmonary:     Effort: Pulmonary effort is normal.     Breath sounds: Normal  breath sounds.  Abdominal:     General: Bowel sounds are normal.     Palpations: Abdomen is soft.  Musculoskeletal:     Cervical back: Normal range of motion.  Lymphadenopathy:     Cervical: No cervical adenopathy.  Skin:    General: Skin is warm and dry.     Capillary Refill: Capillary refill takes less than 2 seconds.  Neurological:     Mental Status: She is alert and oriented to person, place, and time.  Psychiatric:        Behavior: Behavior normal.      Musculoskeletal Exam: C-spine thoracic and lumbar spine with good range of motion.  Shoulder joints, elbow joints, wrist joints, MCPs PIPs and DIPs with good range of motion with no synovitis.  Hip joints, knee joints, ankles, MTPs and PIPs with good range of motion with no synovitis.  CDAI Exam: CDAI Score: 0.2  Patient Global: 1 mm; Provider Global: 1 mm Swollen: 0 ; Tender: 0  Joint Exam 04/17/2020   No joint exam has been documented for this visit   There is currently no information documented on the homunculus. Go to the Rheumatology activity and complete the homunculus joint exam.  Investigation: No additional findings.  Imaging: XR Foot 2 Views Left  Result Date: 04/17/2020 First MTP, PIP and DIP narrowing was noted.  None of the other MTPs showed narrowing.  No erosive changes were noted.  No intertarsal tibiotalar joint space narrowing was noted. Impression: These findings are consistent with osteoarthritis of the foot.  XR Foot 2 Views Right  Result Date: 04/17/2020 First MTP, PIP and DIP narrowing was noted.  None of the other MTP showed narrowing or erosive changes.  No intertarsal or tibiotalar joint space narrowing was noted.  Dorsal spurring was noted. Impression: These findings are consistent with osteoarthritis of the foot.  XR Hand 2 View Left  Result Date: 04/17/2020 PIP and DIP narrowing was noted.  No MCP, intercarpal or radiocarpal joint narrowing was noted.  No erosive changes were noted.   Juxta-articular osteopenia was noted. Impression: These findings are consistent with rheumatoid arthritis and osteoarthritis overlap.  XR Hand 2 View Right  Result Date: 04/17/2020 PIP and DIP narrowing was noted.  No MCP, intercarpal or radiocarpal joint narrowing was noted.  No erosive changes were noted.  Juxta-articular osteopenia was noted. Impression: These findings are consistent with rheumatoid arthritis and osteoarthritis overlap.   Recent Labs: Lab Results  Component Value Date   WBC 4.8 11/27/2019   HGB 14.5 11/27/2019   PLT 258 11/27/2019   NA 139 11/27/2019   K 4.4 11/27/2019   CL 103 11/27/2019   CO2 23 11/27/2019   GLUCOSE 90 11/27/2019   BUN 6 11/27/2019   CREATININE 0.77 11/27/2019   BILITOT 0.4 11/27/2019   ALKPHOS 39 11/27/2019   AST 18 11/27/2019   ALT 14 11/27/2019   PROT 6.2 11/27/2019   ALBUMIN 4.7 11/27/2019  CALCIUM 9.3 11/27/2019   GFRAA 100 11/27/2019    Speciality Comments: PLQ eye exam: 05/24/19  normal. Dr. Elson Areas. Follow up in 12 months.  Procedures:  No procedures performed Allergies: Patient has no known allergies.   Assessment / Plan:     Visit Diagnoses: Seronegative rheumatoid arthritis ([place]) -patient denies any increased joint pain or joint swelling.  X-rays were obtained today to monitor for disease progression.  No radiographic progression was noted.  No erosive changes were noted.  X-rays were reviewed with the patient.  Plan: XR Hand 2 View Right, XR Hand 2 View Left, XR Foot 2 Views Right, XR Foot 2 Views Left  High risk medication use - Plaquenil 200 mg 1 tablet daily. PLQ eye exam: 05/24/19.  She has eye exam scheduled in September.- Plan: CBC with Differential/Platelet, COMPLETE METABOLIC PANEL WITH GFR today and every 5 months.  Primary osteoarthritis of both knees-currently not having much discomfort.  Osteopenia of left hip - DEXA on 12/12/18: total left hip BMD 0.715 with T-score of 1.9.  Use of calcium vitamin D and  resistive exercise were discussed.  Chronic nonintractable headache, unspecified headache type  Madarosis of eyelid, unspecified laterality  Orders: Orders Placed This Encounter  Procedures  . XR Hand 2 View Right  . XR Hand 2 View Left  . XR Foot 2 Views Right  . XR Foot 2 Views Left  . CBC with Differential/Platelet  . COMPLETE METABOLIC PANEL WITH GFR   No orders of the defined types were placed in this encounter.    Follow-Up Instructions: Return in about 5 months (around 09/17/2020) for Rheumatoid arthritis, Osteoarthritis.   Bo Merino, MD  Note - This record has been created using Editor, commissioning.  Chart creation errors have been sought, but may not always  have been located. Such creation errors do not reflect on  the standard of medical care.

## 2020-04-17 ENCOUNTER — Ambulatory Visit: Payer: Self-pay

## 2020-04-17 ENCOUNTER — Ambulatory Visit (INDEPENDENT_AMBULATORY_CARE_PROVIDER_SITE_OTHER): Payer: BC Managed Care – PPO | Admitting: Rheumatology

## 2020-04-17 ENCOUNTER — Encounter: Payer: Self-pay | Admitting: Rheumatology

## 2020-04-17 ENCOUNTER — Other Ambulatory Visit: Payer: Self-pay

## 2020-04-17 VITALS — BP 116/82 | HR 66 | Resp 13 | Ht 63.5 in | Wt 149.2 lb

## 2020-04-17 DIAGNOSIS — M170 Bilateral primary osteoarthritis of knee: Secondary | ICD-10-CM | POA: Diagnosis not present

## 2020-04-17 DIAGNOSIS — Z79899 Other long term (current) drug therapy: Secondary | ICD-10-CM | POA: Diagnosis not present

## 2020-04-17 DIAGNOSIS — M85852 Other specified disorders of bone density and structure, left thigh: Secondary | ICD-10-CM | POA: Diagnosis not present

## 2020-04-17 DIAGNOSIS — R519 Headache, unspecified: Secondary | ICD-10-CM

## 2020-04-17 DIAGNOSIS — M0600 Rheumatoid arthritis without rheumatoid factor, unspecified site: Secondary | ICD-10-CM

## 2020-04-17 DIAGNOSIS — H02729 Madarosis of unspecified eye, unspecified eyelid and periocular area: Secondary | ICD-10-CM

## 2020-04-17 DIAGNOSIS — G8929 Other chronic pain: Secondary | ICD-10-CM

## 2020-04-17 NOTE — Patient Instructions (Signed)
*  Your next Plaquenil eye exam is due end of July.  Please have your ophthalmologist fax [place] the results of your exam to [phone]*  Standing Labs We placed an order today for your standing lab work.   Please have your standing labs drawn in November and then every 5 months.  If possible, please have your labs drawn 2 weeks prior to your appointment so that the provider can discuss your results at your appointment.  We have open lab daily Monday through Thursday from 8:30-12:30 PM and 1:30-4:30 PM and Friday from 8:30-12:30 PM and 1:30-4:00 PM at the office of Dr. Bo Merino, [facility].   You may experience shorter wait times on Monday and Friday afternoons. The office is located at [address], [place], [place], [place] [address] No appointment is necessary.   Labs are drawn by [facility].  You may receive a bill from [facility] for your lab work.  If you wish to have your labs drawn at another location, please call the office 24 hours in advance to send orders.  If you have any questions regarding directions or hours of operation,  please call [phone].   As a reminder, please drink plenty of water prior to coming for your lab work. Thanks!

## 2020-04-18 ENCOUNTER — Other Ambulatory Visit: Payer: Self-pay | Admitting: Physician Assistant

## 2020-04-18 DIAGNOSIS — M0600 Rheumatoid arthritis without rheumatoid factor, unspecified site: Secondary | ICD-10-CM

## 2020-04-18 LAB — COMPLETE METABOLIC PANEL WITH GFR
AG Ratio: 2.7 (calc) — ABNORMAL HIGH (ref 1.0–2.5)
ALT: 21 U/L (ref 6–29)
AST: 20 U/L (ref 10–35)
Albumin: 4.6 g/dL (ref 3.6–5.1)
Alkaline phosphatase (APISO): 41 U/L (ref 37–153)
BUN: 8 mg/dL (ref 7–25)
CO2: 26 mmol/L (ref 20–32)
Calcium: 9.6 mg/dL (ref 8.6–10.4)
Chloride: 104 mmol/L (ref 98–110)
Creat: 0.76 mg/dL (ref 0.50–1.05)
GFR, Est African American: 102 mL/min/{1.73_m2} (ref >=60)
GFR, Est Non African American: 88 mL/min/{1.73_m2} (ref >=60)
Globulin: 1.7 g/dL (calc) — ABNORMAL LOW (ref 1.9–3.7)
Glucose, Bld: 79 mg/dL (ref 65–99)
Potassium: 4.2 mmol/L (ref 3.5–5.3)
Sodium: 140 mmol/L (ref 135–146)
Total Bilirubin: 0.6 mg/dL (ref 0.2–1.2)
Total Protein: 6.3 g/dL (ref 6.1–8.1)

## 2020-04-18 LAB — CBC WITH DIFFERENTIAL/PLATELET
Absolute Monocytes: 366 cells/uL (ref 200–950)
Basophils Absolute: 30 cells/uL (ref 0–200)
Basophils Relative: 0.8 %
Eosinophils Absolute: 118 cells/uL (ref 15–500)
Eosinophils Relative: 3.2 %
HCT: 40.3 % (ref 35.0–45.0)
Hemoglobin: 14 g/dL (ref 11.7–15.5)
Lymphs Abs: 1299 cells/uL (ref 850–3900)
MCH: 31.2 pg (ref 27.0–33.0)
MCHC: 34.7 g/dL (ref 32.0–36.0)
MCV: 89.8 fL (ref 80.0–100.0)
MPV: 9.8 fL (ref 7.5–12.5)
Monocytes Relative: 9.9 %
Neutro Abs: 1887 cells/uL (ref 1500–7800)
Neutrophils Relative %: 51 %
Platelets: 222 10*3/uL (ref 140–400)
RBC: 4.49 10*6/uL (ref 3.80–5.10)
RDW: 11.6 % (ref 11.0–15.0)
Total Lymphocyte: 35.1 %
WBC: 3.7 10*3/uL — ABNORMAL LOW (ref 3.8–10.8)

## 2020-04-18 NOTE — Progress Notes (Signed)
WBC is mildly decreased.  Rest the labs are normal.  We will continue to monitor.  No change in therapy advised.

## 2020-04-18 NOTE — Telephone Encounter (Signed)
Last Visit: 04/17/2020 Next Visit due November 2021. Message sent to the front to schedule. Labs: 04/17/2020 WBC is mildly decreased. Rest the labs are normal. PLQ eye exam: 05/24/19  normal.   Current Dose per office note on 04/17/2020: Plaquenil 200 mg 1 tablet daily.  Okay to refill PLQ?

## 2020-04-18 NOTE — Telephone Encounter (Signed)
LMOM for patient to call and schedule her 5 month follow-up appointment.

## 2020-04-18 NOTE — Telephone Encounter (Signed)
Please schedule patient for a follow up visit. Patient due November 2021. Thanks!  

## 2020-04-19 DIAGNOSIS — L918 Other hypertrophic disorders of the skin: Secondary | ICD-10-CM | POA: Diagnosis not present

## 2020-04-19 DIAGNOSIS — D485 Neoplasm of uncertain behavior of skin: Secondary | ICD-10-CM | POA: Diagnosis not present

## 2020-04-19 DIAGNOSIS — D225 Melanocytic nevi of trunk: Secondary | ICD-10-CM | POA: Diagnosis not present

## 2020-05-07 DIAGNOSIS — Z86010 Personal history of colonic polyps: Secondary | ICD-10-CM | POA: Diagnosis not present

## 2020-05-07 DIAGNOSIS — Z1211 Encounter for screening for malignant neoplasm of colon: Secondary | ICD-10-CM | POA: Diagnosis not present

## 2020-05-07 DIAGNOSIS — K219 Gastro-esophageal reflux disease without esophagitis: Secondary | ICD-10-CM | POA: Diagnosis not present

## 2020-05-22 DIAGNOSIS — L988 Other specified disorders of the skin and subcutaneous tissue: Secondary | ICD-10-CM | POA: Diagnosis not present

## 2020-05-22 DIAGNOSIS — D485 Neoplasm of uncertain behavior of skin: Secondary | ICD-10-CM | POA: Diagnosis not present

## 2020-06-06 ENCOUNTER — Telehealth: Payer: Self-pay | Admitting: Neurology

## 2020-06-06 ENCOUNTER — Other Ambulatory Visit: Payer: Self-pay

## 2020-06-06 ENCOUNTER — Ambulatory Visit (INDEPENDENT_AMBULATORY_CARE_PROVIDER_SITE_OTHER): Payer: BC Managed Care – PPO | Admitting: Neurology

## 2020-06-06 ENCOUNTER — Encounter: Payer: Self-pay | Admitting: Neurology

## 2020-06-06 VITALS — BP 112/62 | HR 69 | Ht 63.5 in | Wt 145.0 lb

## 2020-06-06 DIAGNOSIS — R799 Abnormal finding of blood chemistry, unspecified: Secondary | ICD-10-CM | POA: Diagnosis not present

## 2020-06-06 DIAGNOSIS — R413 Other amnesia: Secondary | ICD-10-CM | POA: Insufficient documentation

## 2020-06-06 DIAGNOSIS — R946 Abnormal results of thyroid function studies: Secondary | ICD-10-CM | POA: Diagnosis not present

## 2020-06-06 DIAGNOSIS — R4789 Other speech disturbances: Secondary | ICD-10-CM | POA: Insufficient documentation

## 2020-06-06 DIAGNOSIS — D519 Vitamin B12 deficiency anemia, unspecified: Secondary | ICD-10-CM | POA: Diagnosis not present

## 2020-06-06 NOTE — Progress Notes (Signed)
HISTORICAL  Stacey Craig is a 59 year old female, seen in request by her primary care physician Dr. Sharlet Salina, Benjamine Mola for evaluation of memory loss, initial evaluation was on June 06, 2020.  I reviewed and summarized the referring note. 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  ALLERGIES: No Known Allergies  HOME MEDICATIONS: Current Outpatient Medications  Medication Sig Dispense Refill  . bimatoprost (LATISSE) 0.03 % ophthalmic solution APPLY TO THE EYELASHES OF BOTH EYES AT BEDTIME AS NEEDED  12  . calcium-vitamin D (OSCAL WITH D) 500-200 MG-UNIT tablet Take 1 tablet by mouth.    . Cholecalciferol (VITAMIN D3) 1.25 MG (50000 UT) CAPS Take by mouth.    . diclofenac sodium (VOLTAREN) 1 % GEL Apply 3 grams to 3 large joints up  to 3 times daily 3 Tube 3  . hydroxychloroquine (PLAQUENIL) 200 MG tablet TAKE 1 TABLET BY MOUTH EVERY DAY 90 tablet 0  . tretinoin (RETIN-A) 0.025 % cream APPLY TO AFFECTED AREAS AT BEDTIME     No current facility-administered medications for this visit.    PAST MEDICAL HISTORY: Past Medical History:  Diagnosis Date  . Arthritis   . Breast cancer ([place])   . Colon polyps   . Genital warts   . UTI (lower urinary tract infection)     PAST SURGICAL HISTORY: Past Surgical History:  Procedure Laterality Date  . AUGMENTATION MAMMAPLASTY Bilateral   . BREAST SURGERY    . FOREARM SURGERY Left   . SKIN SURGERY  03/2020   chest, back     FAMILY HISTORY: Family History  Problem Relation Age of Onset  . Cancer Father        brain, bladder, and skin  . Heart disease Father   . Stroke Father   . Cancer Maternal Grandmother        breast  . Cancer Paternal Grandmother        lung  . Cancer Paternal Grandfather        bone    SOCIAL HISTORY: Social History   Socioeconomic History  . Marital status: Married    Spouse name: Not on file  . Number of children: Not on file  . Years of education: Not on file  . Highest education level: Not on file  Occupational History  . Not on file  Tobacco Use  . Smoking  status: Former Smoker    Packs/day: 0.50    Years: 5.00    Pack years: 2.50    Types: Cigarettes    Quit date: 02/22/1994    Years since quitting: 26.3  . Smokeless tobacco: Never Used  Vaping Use  . Vaping Use: Never used  Substance and Sexual Activity  . Alcohol use: No    Alcohol/week: 0.0 standard drinks  . Drug use: No  . Sexual activity: Not on file  Other Topics Concern  . Not on file  Social History Narrative   Right handed    Caffeine- 6 cups daily    Social Determinants of Health   Financial Resource Strain:   . Difficulty of Paying Living Expenses:   Food Insecurity:   . Worried About Charity fundraiser in the Last Year:   . Arboriculturist in  the Last Year:   Transportation Needs:   . Film/video editor (Medical):   Marland Kitchen Lack of Transportation (Non-Medical):   Physical Activity:   . Days of Exercise per Week:   . Minutes of Exercise per Session:   Stress:   . Feeling of Stress :   Social Connections:   . Frequency of Communication with Friends and Family:   . Frequency of Social Gatherings with Friends and Family:   . Attends Religious Services:   . Active Member of Clubs or Organizations:   . Attends Archivist Meetings:   Marland Kitchen Marital Status:   Intimate Partner Violence:   . Fear of Current or Ex-Partner:   . Emotionally Abused:   Marland Kitchen Physically Abused:   . Sexually Abused:      PHYSICAL EXAM   Vitals:   06/06/20 0950  BP: 112/62  Pulse: 69  SpO2: 97%  Weight: 145 lb (65.8 kg)  Height: 5' 3.5" (1.613 m)   Not recorded     Body mass index is 25.28 kg/m.  PHYSICAL EXAMNIATION:  Gen: NAD, conversant, well nourised, well groomed                     Cardiovascular: Regular rate rhythm, no peripheral edema, warm, nontender. Eyes: Conjunctivae clear without exudates or hemorrhage Neck: Supple, no carotid bruits. Pulmonary: Clear to auscultation bilaterally   NEUROLOGICAL EXAM:  MENTAL STATUS: Montreal Cognitive Assessment  06/06/2020  Visuospatial/ Executive (0/5) 5  Naming (0/3) 2  Attention: Read list of digits (0/2) 2  Attention: Read list of letters (0/1) 1  Attention: Serial 7 subtraction starting at 100 (0/3) 2  Language: Repeat phrase (0/2) 2  Language : Fluency (0/1) 1  Abstraction (0/2) 2  Delayed Recall (0/5) 5  Orientation (0/6) 6  Total 28  Adjusted Score (based on education) 28     CRANIAL NERVES: CN II: Visual fields are full to confrontation. Pupils are round equal and briskly reactive to light. CN III, IV, VI: extraocular movement are normal. No ptosis. CN V: Facial sensation is intact to light touch CN VII: Face is symmetric with normal eye closure  CN VIII: Hearing  is normal to causal conversation. CN IX, X: Phonation is normal. CN XI: Head turning and shoulder shrug are intact  MOTOR: There is no pronator drift of out-stretched arms. Muscle bulk and tone are normal. Muscle strength is normal.  REFLEXES: Reflexes are 2+ and symmetric at the biceps, triceps, knees, and ankles. Plantar responses are flexor.  SENSORY: Intact to light touch, pinprick and vibratory sensation are intact in fingers and  toes.  COORDINATION: There is no trunk or limb dysmetria noted.  GAIT/STANCE: Posture is normal. Gait is steady with normal steps, base, arm swing, and turning. Heel and toe walking are normal. Tandem gait is normal.  Romberg is absent.   DIAGNOSTIC DATA (LABS, IMAGING, TESTING) - I reviewed patient records, labs, notes, testing and imaging myself where available.   ASSESSMENT AND PLAN  Stacey Craig is a 59 y.o. female   Memory loss:  Family history of dementia  APOE4/APOE4 by genetic testing per patient, this will put her at high risk for developing central nervous system degenerative disorder,  MRI of brain  Laboratory evaluation to rule out treatable etiology  Marcial Pacas, M.D. Ph.D.  [facility] [address] [place], [place] [address] Ph: [phone] Fax: [phone]  CC:  Hoyt Koch, MD [address] [place],  [place] [address]

## 2020-06-06 NOTE — Telephone Encounter (Signed)
BCBS Auth: [id number] (exp. 06/06/20 to 12/02/20)/cigna order sent to [facility]. They will reach out to the patient and obtain the auth for cigna.

## 2020-06-07 LAB — TSH: TSH: 0.36 u[IU]/mL — ABNORMAL LOW (ref 0.450–4.500)

## 2020-06-07 LAB — VITAMIN B12: Vitamin B-12: 418 pg/mL (ref 232–1245)

## 2020-06-07 LAB — RPR: RPR Ser Ql: NONREACTIVE

## 2020-06-10 ENCOUNTER — Telehealth: Payer: Self-pay | Admitting: *Deleted

## 2020-06-10 NOTE — Telephone Encounter (Signed)
-----   Message from Melvenia Beam, MD sent at 06/10/2020  1:13 PM EDT -----  TSH is slightly abnormal but better that 1 year ago, would follow with primary care for her thyroid. Otherwise normal. thanks

## 2020-06-10 NOTE — Telephone Encounter (Signed)
The patient was notified of her lab results and agreeable to continue follow up w/ PCP for her TSH.

## 2020-06-10 NOTE — Telephone Encounter (Signed)
[name]: [id number] (exp. 06/07/20 to 09/05/20) patient is scheduled at [facility] for 06/13/20.

## 2020-06-12 ENCOUNTER — Encounter: Payer: Self-pay | Admitting: Internal Medicine

## 2020-06-12 DIAGNOSIS — K6389 Other specified diseases of intestine: Secondary | ICD-10-CM | POA: Diagnosis not present

## 2020-06-12 DIAGNOSIS — K635 Polyp of colon: Secondary | ICD-10-CM | POA: Diagnosis not present

## 2020-06-12 DIAGNOSIS — D125 Benign neoplasm of sigmoid colon: Secondary | ICD-10-CM | POA: Diagnosis not present

## 2020-06-12 DIAGNOSIS — Z1211 Encounter for screening for malignant neoplasm of colon: Secondary | ICD-10-CM | POA: Diagnosis not present

## 2020-06-12 DIAGNOSIS — D122 Benign neoplasm of ascending colon: Secondary | ICD-10-CM | POA: Diagnosis not present

## 2020-06-13 ENCOUNTER — Ambulatory Visit
Admission: RE | Admit: 2020-06-13 | Discharge: 2020-06-13 | Disposition: A | Discharge status: Home / Self Care | Payer: BC Managed Care – PPO | Source: Ambulatory Visit | Attending: Neurology | Admitting: Neurology

## 2020-06-13 DIAGNOSIS — R4789 Other speech disturbances: Secondary | ICD-10-CM | POA: Diagnosis not present

## 2020-06-13 DIAGNOSIS — E669 Obesity, unspecified: Secondary | ICD-10-CM | POA: Diagnosis not present

## 2020-06-13 DIAGNOSIS — R413 Other amnesia: Secondary | ICD-10-CM | POA: Diagnosis not present

## 2020-06-13 DIAGNOSIS — Z6825 Body mass index (BMI) 25.0-25.9, adult: Secondary | ICD-10-CM | POA: Diagnosis not present

## 2020-06-14 LAB — HM COLONOSCOPY

## 2020-06-17 ENCOUNTER — Telehealth: Payer: Self-pay | Admitting: *Deleted

## 2020-06-17 NOTE — Telephone Encounter (Signed)
I have spoken to the patient and provided her with the MRI brain results.

## 2020-06-17 NOTE — Telephone Encounter (Signed)
-----   Message from Britt Bottom, MD sent at 06/17/2020  9:47 AM EDT ----- Please let the patient know that the MRI of the brain was normal.

## 2020-06-18 ENCOUNTER — Encounter: Payer: Self-pay | Admitting: Internal Medicine

## 2020-07-11 ENCOUNTER — Other Ambulatory Visit: Payer: Self-pay | Admitting: Rheumatology

## 2020-07-11 DIAGNOSIS — M0600 Rheumatoid arthritis without rheumatoid factor, unspecified site: Secondary | ICD-10-CM

## 2020-07-11 NOTE — Telephone Encounter (Signed)
Last Visit: 04/17/2020 Next Visit: due 08/2020 - message sent to front to schedule Labs: WBC is mildly decreased. Rest the labs are normal. We will continue to monitor. No change in therapy advised. 04/17/2020 Eye exam: 06/26/2020 WNL  Current Dose per office note 04/17/2020: Plaquenil 200 mg 1 tablet daily IR:[id number] rheumatoid arthritis   Okay to refill Plaquenil per Dr. Estanislado Pandy.

## 2020-07-11 NOTE — Telephone Encounter (Signed)
Please schedule patient for follow-up visit patient is due November 2021. Thanks!

## 2020-07-31 DIAGNOSIS — D0512 Intraductal carcinoma in situ of left breast: Secondary | ICD-10-CM | POA: Diagnosis not present

## 2020-08-01 ENCOUNTER — Encounter: Payer: Self-pay | Admitting: Internal Medicine

## 2020-08-14 DIAGNOSIS — C50919 Malignant neoplasm of unspecified site of unspecified female breast: Secondary | ICD-10-CM | POA: Diagnosis not present

## 2020-08-28 DIAGNOSIS — Z6823 Body mass index (BMI) 23.0-23.9, adult: Secondary | ICD-10-CM | POA: Diagnosis not present

## 2020-09-02 ENCOUNTER — Encounter: Payer: Self-pay | Admitting: Internal Medicine

## 2020-09-10 DIAGNOSIS — D225 Melanocytic nevi of trunk: Secondary | ICD-10-CM | POA: Diagnosis not present

## 2020-09-10 DIAGNOSIS — Z1283 Encounter for screening for malignant neoplasm of skin: Secondary | ICD-10-CM | POA: Diagnosis not present

## 2020-09-10 DIAGNOSIS — L738 Other specified follicular disorders: Secondary | ICD-10-CM | POA: Diagnosis not present

## 2020-09-17 NOTE — Progress Notes (Signed)
Office Visit Note  Patient: Stacey Craig             Date of Birth: 1961/07/10           MRN: [id number]             PCP: Hoyt Koch, MD Referring: Hoyt Koch, * Visit Date: 10/01/2020 Occupation: @GUAROCC @  Subjective:  Medication management.   History of Present Illness: Stacey Craig is a 59 y.o. female with history of rheumatoid arthritis, osteoarthritis and osteopenia.  She states she has been doing well.  She has been on Plaquenil 200 mg p.o. daily.  She has not had any joint swelling in a long time.  She continues to have some stiffness from osteoarthritis.  She states she was diagnosed with breast cancer in February 2021.  She had left mastectomy and radiation therapy.  She has been doing well.  She has been exercising on a regular basis.  Activities of Daily Living:  Patient reports morning stiffness for 0 minutes.   Patient Denies nocturnal pain.  Difficulty dressing/grooming: Denies Difficulty climbing stairs: Denies Difficulty getting out of chair: Denies Difficulty using hands for taps, buttons, cutlery, and/or writing: Denies  Review of Systems  Constitutional: Negative for fatigue.  HENT: Negative for mouth sores, mouth dryness and nose dryness.   Eyes: Negative for pain, itching, visual disturbance and dryness.  Respiratory: Negative for cough, hemoptysis, shortness of breath and difficulty breathing.   Cardiovascular: Negative for chest pain, palpitations and swelling in legs/feet.  Gastrointestinal: Negative for abdominal pain, blood in stool, constipation and diarrhea.  Endocrine: Positive for increased urination.  Genitourinary: Negative for painful urination.  Musculoskeletal: Negative for arthralgias, joint pain, joint swelling, myalgias, muscle weakness, morning stiffness, muscle tenderness and myalgias.  Skin: Negative for color change, rash and redness.  Allergic/Immunologic: Negative for susceptible to infections.   Neurological: Negative for dizziness, numbness, headaches, memory loss and weakness.  Hematological: Negative for swollen glands.  Psychiatric/Behavioral: Negative for confusion and sleep disturbance.    PMFS History:  Patient Active Problem List   Diagnosis Date Noted  . Word finding difficulty 06/06/2020  . Memory loss 06/06/2020  . GERD (gastroesophageal reflux disease) 02/15/2020  . Malignant neoplasm of upper-outer quadrant of left breast in female, estrogen receptor positive ([place]) 12/20/2019  . Routine general medical examination at a health care facility 09/12/2018  . Chronic headaches 11/03/2017  . Rheumatoid arthritis ([place]) 06/21/2015    Past Medical History:  Diagnosis Date  . Arthritis   . Breast cancer ([place])   . Colon polyps   . Genital warts   . UTI (lower urinary tract infection)     Family History  Problem Relation Age of Onset  . Cancer Father        brain, bladder, and skin  . Heart disease Father   . Stroke Father   . Cancer Maternal Grandmother        breast  . Cancer Paternal Grandmother        lung  . Cancer Paternal Grandfather        bone   Past Surgical History:  Procedure Laterality Date  . AUGMENTATION MAMMAPLASTY Bilateral   . BREAST SURGERY    . FOREARM SURGERY Left   . SKIN SURGERY  03/2020   chest, back    Social History   Social History Narrative   Right handed    Caffeine- 6 cups daily    Immunization History  Administered Date(s) Administered  .  PFIZER SARS-COV-2 Vaccination 01/22/2020, 02/07/2020  . Td 10/26/1990  . Zoster Recombinat (Shingrix) 09/12/2018, 05/18/2019     Objective: Vital Signs: BP 108/77 (BP Location: Left Arm, Patient Position: Sitting, Cuff Size: Small)   Pulse 91   Ht 5' 3.5" (1.613 m)   Wt 131 lb 12.8 oz (59.8 kg)   LMP 11/13/2011   BMI 22.98 kg/m    Physical Exam Vitals and nursing note reviewed.  Constitutional:      Appearance: She is well-developed.  HENT:     Head: Normocephalic and  atraumatic.  Eyes:     Conjunctiva/sclera: Conjunctivae normal.  Cardiovascular:     Rate and Rhythm: Normal rate and regular rhythm.     Heart sounds: Normal heart sounds.  Pulmonary:     Effort: Pulmonary effort is normal.     Breath sounds: Normal breath sounds.  Abdominal:     General: Bowel sounds are normal.     Palpations: Abdomen is soft.  Musculoskeletal:     Cervical back: Normal range of motion.  Lymphadenopathy:     Cervical: No cervical adenopathy.  Skin:    General: Skin is warm and dry.     Capillary Refill: Capillary refill takes less than 2 seconds.  Neurological:     Mental Status: She is alert and oriented to person, place, and time.  Psychiatric:        Behavior: Behavior normal.      Musculoskeletal Exam: C-spine, thoracic and lumbar spine with good range of motion.  Shoulder joints, elbow joints, wrist joints with good range of motion.  There was no synovitis over MCPs or PIP joints.  She has bilateral PIP and DIP thickening.  Hip joints, knee joints, ankles, MTPs with good range of motion with no synovitis.  CDAI Exam: CDAI Score: 0  Patient Global: 0 mm; Provider Global: 0 mm Swollen: 0 ; Tender: 0  Joint Exam 10/01/2020   No joint exam has been documented for this visit   There is currently no information documented on the homunculus. Go to the Rheumatology activity and complete the homunculus joint exam.  Investigation: No additional findings.  Imaging: No results found.  Recent Labs: Lab Results  Component Value Date   WBC 3.7 (L) 04/17/2020   HGB 14.0 04/17/2020   PLT 222 04/17/2020   NA 140 04/17/2020   K 4.2 04/17/2020   CL 104 04/17/2020   CO2 26 04/17/2020   GLUCOSE 79 04/17/2020   BUN 8 04/17/2020   CREATININE 0.76 04/17/2020   BILITOT 0.6 04/17/2020   ALKPHOS 39 11/27/2019   AST 20 04/17/2020   ALT 21 04/17/2020   PROT 6.3 04/17/2020   ALBUMIN 4.7 11/27/2019   CALCIUM 9.6 04/17/2020   GFRAA 102 04/17/2020     Speciality Comments: PLQ eye exam: 06/26/2020 WNL  Dr. Lester [address]. Follow up in 12 months.  Procedures:  No procedures performed Allergies: Patient has no known allergies.   Assessment / Plan:     Visit Diagnoses: Seronegative rheumatoid arthritis (HCC)-she had no synovitis on examination.  She had been taking Plaquenil 200 mg p.o. daily.  Have advised her to decrease Plaquenil to 1 tablet every other day.  If her symptoms flare then she can go back to the original dose of daily Plaquenil.  High risk medication use - Plaquenil 200 mg 1 tablet daily. PLQ eye exam: 06/26/2020 - Plan: CBC with Differential/Platelet, COMPLETE METABOLIC PANEL WITH GFR today and then every 5 months.  Her last eye  examination was June 26, 2020.    Primary osteoarthritis of both knees-she is currently not having any discomfort.  Osteopenia of left hip - DEXA on 12/12/18: total left hip BMD 0.715 with T-score of -1.9. -Use of calcium, vitamin D and  resistive exercises were discussed.  She goes to the gym on a regular basis.  Plan: DG BONE DENSITY (DXA)  Vitamin D deficiency - Plan: VITAMIN D 25 Hydroxy (Vit-D Deficiency, Fractures), DG BONE DENSITY (DXA).  She has been taking over-the-counter supplement of vitamin D.  Chronic nonintractable headache, unspecified headache type  Madarosis of eyelid, unspecified laterality  Malignant neoplasm of upper-outer quadrant of left breast in female, estrogen receptor positive ([place]) - dxd 02/21 mastectomy and RTX, no CTX  Educated about COVID-19 virus infection-she is fully vaccinated against COVID-19.  I advised her to get a booster.  Use of mask, social distancing and hand hygiene was discussed.  Orders: Orders Placed This Encounter  Procedures  . DG BONE DENSITY (DXA)  . CBC with Differential/Platelet  . COMPLETE METABOLIC PANEL WITH GFR  . VITAMIN D 25 Hydroxy (Vit-D Deficiency, Fractures)   No orders of the defined types were placed in this  encounter.   Follow-Up Instructions: Return in about 5 months (around 03/01/2021) for Rheumatoid arthritis.   Bo Merino, MD  Note - This record has been created using Editor, commissioning.  Chart creation errors have been sought, but may not always  have been located. Such creation errors do not reflect on  the standard of medical care.

## 2020-09-26 ENCOUNTER — Ambulatory Visit: Payer: BC Managed Care – PPO | Admitting: Rheumatology

## 2020-10-01 ENCOUNTER — Ambulatory Visit (INDEPENDENT_AMBULATORY_CARE_PROVIDER_SITE_OTHER): Payer: BC Managed Care – PPO | Admitting: Rheumatology

## 2020-10-01 ENCOUNTER — Other Ambulatory Visit: Payer: Self-pay

## 2020-10-01 ENCOUNTER — Encounter: Payer: Self-pay | Admitting: Rheumatology

## 2020-10-01 VITALS — BP 108/77 | HR 91 | Ht 63.5 in | Wt 131.8 lb

## 2020-10-01 DIAGNOSIS — E559 Vitamin D deficiency, unspecified: Secondary | ICD-10-CM

## 2020-10-01 DIAGNOSIS — M85852 Other specified disorders of bone density and structure, left thigh: Secondary | ICD-10-CM

## 2020-10-01 DIAGNOSIS — G8929 Other chronic pain: Secondary | ICD-10-CM

## 2020-10-01 DIAGNOSIS — Z79899 Other long term (current) drug therapy: Secondary | ICD-10-CM | POA: Diagnosis not present

## 2020-10-01 DIAGNOSIS — M0600 Rheumatoid arthritis without rheumatoid factor, unspecified site: Secondary | ICD-10-CM

## 2020-10-01 DIAGNOSIS — H02729 Madarosis of unspecified eye, unspecified eyelid and periocular area: Secondary | ICD-10-CM

## 2020-10-01 DIAGNOSIS — M170 Bilateral primary osteoarthritis of knee: Secondary | ICD-10-CM | POA: Diagnosis not present

## 2020-10-01 DIAGNOSIS — Z7189 Other specified counseling: Secondary | ICD-10-CM

## 2020-10-01 DIAGNOSIS — C50412 Malignant neoplasm of upper-outer quadrant of left female breast: Secondary | ICD-10-CM

## 2020-10-01 DIAGNOSIS — R519 Other chronic pain: Secondary | ICD-10-CM

## 2020-10-01 DIAGNOSIS — Z170 Estrogen receptor positive status [ER+]: Secondary | ICD-10-CM

## 2020-10-01 NOTE — Patient Instructions (Addendum)
Standing Labs We placed an order today for your standing lab work.   Please have your standing labs drawn in May  If possible, please have your labs drawn 2 weeks prior to your appointment so that the provider can discuss your results at your appointment.  We have open lab daily Monday through Thursday from 8:30-12:30 PM and 1:30-4:30 PM and Friday from 8:30-12:30 PM and 1:30-4:00 PM at the office of Dr. Bo Merino, [facility].   Please be advised, patients with office appointments requiring lab work will take precedents over walk-in lab work.  If possible, please come for your lab work on Monday and Friday afternoons, as you may experience shorter wait times. The office is located at [address], [place], [place], [place] [address] No appointment is necessary.   Labs are drawn by [facility]. Please bring your co-pay at the time of your lab draw.  You may receive a bill from [facility] for your lab work.  If you wish to have your labs drawn at another location, please call the office 24 hours in advance to send orders.  If you have any questions regarding directions or hours of operation,  please call [phone].   As a reminder, please drink plenty of water prior to coming for your lab work. Thanks!    COVID-19 vaccine recommendations:   COVID-19 vaccine is recommended for everyone (unless you are allergic to a vaccine component), even if you are on a medication that suppresses your immune system.   Do not take Tylenol or any anti-inflammatory medications (NSAIDs) 24 hours prior to the COVID-19 vaccination.   There is no direct evidence about the efficacy of the COVID-19 vaccine in individuals who are on medications that suppress the immune system.   Even if you are fully vaccinated, and you are on any medications that suppress your immune system, please continue to wear a mask, maintain at least six feet social distance and practice hand hygiene.   If you develop  a COVID-19 infection, please contact your PCP or our office to determine if you need monoclonal antibody infusion.  The booster vaccine is now available for immunocompromised patients.   Please see the following web sites for updated information.   [email or web address]

## 2020-10-02 LAB — CBC WITH DIFFERENTIAL/PLATELET
Absolute Monocytes: 413 cells/uL (ref 200–950)
Basophils Absolute: 48 cells/uL (ref 0–200)
Basophils Relative: 1 %
Eosinophils Absolute: 149 cells/uL (ref 15–500)
Eosinophils Relative: 3.1 %
HCT: 37.7 % (ref 35.0–45.0)
Hemoglobin: 13.1 g/dL (ref 11.7–15.5)
Lymphs Abs: 1752 cells/uL (ref 850–3900)
MCH: 31 pg (ref 27.0–33.0)
MCHC: 34.7 g/dL (ref 32.0–36.0)
MCV: 89.1 fL (ref 80.0–100.0)
MPV: 10 fL (ref 7.5–12.5)
Monocytes Relative: 8.6 %
Neutro Abs: 2438 cells/uL (ref 1500–7800)
Neutrophils Relative %: 50.8 %
Platelets: 238 10*3/uL (ref 140–400)
RBC: 4.23 10*6/uL (ref 3.80–5.10)
RDW: 11.9 % (ref 11.0–15.0)
Total Lymphocyte: 36.5 %
WBC: 4.8 10*3/uL (ref 3.8–10.8)

## 2020-10-02 LAB — COMPLETE METABOLIC PANEL WITH GFR
AG Ratio: 2.9 (calc) — ABNORMAL HIGH (ref 1.0–2.5)
ALT: 12 U/L (ref 6–29)
AST: 17 U/L (ref 10–35)
Albumin: 4.3 g/dL (ref 3.6–5.1)
Alkaline phosphatase (APISO): 41 U/L (ref 37–153)
BUN: 12 mg/dL (ref 7–25)
CO2: 26 mmol/L (ref 20–32)
Calcium: 9.3 mg/dL (ref 8.6–10.4)
Chloride: 105 mmol/L (ref 98–110)
Creat: 0.77 mg/dL (ref 0.50–1.05)
GFR, Est African American: 99 mL/min/{1.73_m2} (ref >=60)
GFR, Est Non African American: 86 mL/min/{1.73_m2} (ref >=60)
Globulin: 1.5 g/dL (calc) — ABNORMAL LOW (ref 1.9–3.7)
Glucose, Bld: 80 mg/dL (ref 65–99)
Potassium: 4 mmol/L (ref 3.5–5.3)
Sodium: 140 mmol/L (ref 135–146)
Total Bilirubin: 0.6 mg/dL (ref 0.2–1.2)
Total Protein: 5.8 g/dL — ABNORMAL LOW (ref 6.1–8.1)

## 2020-10-02 LAB — VITAMIN D 25 HYDROXY (VIT D DEFICIENCY, FRACTURES): Vit D, 25-Hydroxy: 91 ng/mL (ref 30–100)

## 2020-10-02 NOTE — Progress Notes (Signed)
CBC is normal, albumin is mildly decreased, vitamin D is at upper limits of normal.  She may reduce the dose of vitamin D to half of current dose.

## 2020-10-03 ENCOUNTER — Other Ambulatory Visit: Payer: Self-pay | Admitting: Rheumatology

## 2020-10-03 DIAGNOSIS — M0600 Rheumatoid arthritis without rheumatoid factor, unspecified site: Secondary | ICD-10-CM

## 2020-10-03 NOTE — Telephone Encounter (Signed)
Last Visit: 10/01/2020 Next Visit: 03/04/2021 Labs: 10/01/2020 CBC is normal, albumin is mildly decreased, vitamin D is at upper limits of normal. She may reduce the dose of vitamin D to half of current dose. Eye exam:  06/26/2020  Current Dose per office note on 10/01/2020: Plaquenil 200 mg 1 tablet daily HQ:[id number] rheumatoid arthritis  Okay to refill Plaquenil?

## 2020-12-04 DIAGNOSIS — Z86000 Personal history of in-situ neoplasm of breast: Secondary | ICD-10-CM | POA: Diagnosis not present

## 2020-12-04 DIAGNOSIS — Z9889 Other specified postprocedural states: Secondary | ICD-10-CM | POA: Diagnosis not present

## 2020-12-04 DIAGNOSIS — Z09 Encounter for follow-up examination after completed treatment for conditions other than malignant neoplasm: Secondary | ICD-10-CM | POA: Diagnosis not present

## 2020-12-04 DIAGNOSIS — D0512 Intraductal carcinoma in situ of left breast: Secondary | ICD-10-CM | POA: Diagnosis not present

## 2020-12-13 ENCOUNTER — Encounter: Payer: Self-pay | Admitting: Rheumatology

## 2020-12-13 DIAGNOSIS — M8589 Other specified disorders of bone density and structure, multiple sites: Secondary | ICD-10-CM | POA: Diagnosis not present

## 2020-12-18 ENCOUNTER — Telehealth: Payer: Self-pay | Admitting: *Deleted

## 2020-12-18 NOTE — Telephone Encounter (Signed)
Received DEXA results from [facility].  Date of Scan: 12/13/2020 Lowest T-score and lowest site measured: -1.9 Right Femoral Neck Significant changes in BMD and site measured (5% and above): n/a  Current Regimen: Calcium and Vitamin D   Recommendation:Consistent with osteopenia. Continue calcium, vitamin D ands resistive exercises. Repeat DEXA in 2 years.   Reviewed by: Hazel Sams, PA-C  Next Appointment: 03/04/2021  Patient advised.

## 2021-02-05 ENCOUNTER — Telehealth: Payer: Self-pay

## 2021-02-05 DIAGNOSIS — Z79899 Other long term (current) drug therapy: Secondary | ICD-10-CM

## 2021-02-05 DIAGNOSIS — R5383 Other fatigue: Secondary | ICD-10-CM

## 2021-02-05 NOTE — Telephone Encounter (Signed)
Patient called stating she plans to come to the office 2 weeks before her appointment to have labwork and is asking if Iron could be added to her order.  Patient states she is experiencing dizziness and generalized tiredness.

## 2021-02-05 NOTE — Telephone Encounter (Signed)
Advised patient we will add iron panel to lab orders. Patient verbalized understanding and future lab orders have been placed.

## 2021-02-05 NOTE — Telephone Encounter (Signed)
Okay to add an order to draw iron level due to the symptoms patient is experiencing?

## 2021-02-05 NOTE — Telephone Encounter (Signed)
Ok to add iron panel to orders.

## 2021-02-08 ENCOUNTER — Other Ambulatory Visit: Payer: Self-pay | Admitting: Physician Assistant

## 2021-02-08 DIAGNOSIS — M0600 Rheumatoid arthritis without rheumatoid factor, unspecified site: Secondary | ICD-10-CM

## 2021-02-10 NOTE — Telephone Encounter (Signed)
Last Visit: 10/01/2020 Next Visit: 03/04/2021 Labs: 10/01/2020, CBC is normal, albumin is mildly decreased, vitamin D is at upper limits of normal. She may reduce the dose of vitamin D to half of current dose. Eye exam: 06/26/2020  Current Dose per office note 10/01/2020, Plaquenil 200 mg 1 tablet daily DX: Seronegative rheumatoid arthritis   Last Fill: 10/03/2020  Okay to refill Plaquenil?

## 2021-02-19 DIAGNOSIS — C50919 Malignant neoplasm of unspecified site of unspecified female breast: Secondary | ICD-10-CM | POA: Diagnosis not present

## 2021-02-19 NOTE — Progress Notes (Signed)
Office Visit Note  Patient: Stacey Craig             Date of Birth: [birth year]           MRN: [id number]             PCP: Hoyt Koch, MD Referring: Hoyt Koch, * Visit Date: 03/04/2021 Occupation: @GUAROCC @  Subjective:  Discussed DEXA results.   History of Present Illness: Stacey Craig is a 60 y.o. female with history of seronegative rheumatoid arthritis, osteoarthritis and osteopenia.  She has not had rheumatoid arthritis flare in a long time.  She decreased the dose of Plaquenil to 200 mg every other day at after the last visit.  She has not noticed any worsening of her symptoms.  She denies any joint pain or joint swelling.  She has been taking calcium and vitamin D and also has been exercising on a regular basis.  Activities of Daily Living:  Patient reports morning stiffness for 0 minutes.   Patient Denies nocturnal pain.  Difficulty dressing/grooming: Denies Difficulty climbing stairs: Denies Difficulty getting out of chair: Denies Difficulty using hands for taps, buttons, cutlery, and/or writing: Denies  Review of Systems  Constitutional: Negative for fatigue, night sweats, weight gain and weight loss.  HENT: Negative for mouth sores, trouble swallowing, trouble swallowing, mouth dryness and nose dryness.   Eyes: Negative for pain, redness, itching, visual disturbance and dryness.  Respiratory: Negative for cough, hemoptysis, shortness of breath and difficulty breathing.   Cardiovascular: Negative for chest pain, palpitations, hypertension, irregular heartbeat and swelling in legs/feet.  Gastrointestinal: Negative for abdominal pain, blood in stool, constipation and diarrhea.  Endocrine: Negative for increased urination.  Genitourinary: Negative for painful urination and vaginal dryness.  Musculoskeletal: Negative for arthralgias, joint pain, joint swelling, myalgias, muscle weakness, morning stiffness, muscle tenderness and  myalgias.  Skin: Negative for color change, rash, hair loss, redness, skin tightness, ulcers and sensitivity to sunlight.  Allergic/Immunologic: Negative for susceptible to infections.  Neurological: Negative for dizziness, numbness, headaches, memory loss, night sweats and weakness.  Hematological: Negative for swollen glands.  Psychiatric/Behavioral: Negative for depressed mood, confusion and sleep disturbance. The patient is not nervous/anxious.     PMFS History:  Patient Active Problem List   Diagnosis Date Noted  . Word finding difficulty 06/06/2020  . Memory loss 06/06/2020  . GERD (gastroesophageal reflux disease) 02/15/2020  . Malignant neoplasm of upper-outer quadrant of left breast in female, estrogen receptor positive ([place]) 12/20/2019  . Routine general medical examination at a health care facility 09/12/2018  . Chronic headaches 11/03/2017  . Rheumatoid arthritis ([place]) 06/21/2015    Past Medical History:  Diagnosis Date  . Arthritis   . Breast cancer ([place])   . Colon polyps   . Genital warts   . UTI (lower urinary tract infection)     Family History  Problem Relation Age of Onset  . Cancer Father        brain, bladder, and skin  . Heart disease Father   . Stroke Father   . Cancer Maternal Grandmother        breast  . Cancer Paternal Grandmother        lung  . Cancer Paternal Grandfather        bone   Past Surgical History:  Procedure Laterality Date  . AUGMENTATION MAMMAPLASTY Bilateral   . BREAST SURGERY    . FOREARM SURGERY Left   . SKIN SURGERY  03/2020   chest,  back    Social History   Social History Narrative   Right handed    Caffeine- 6 cups daily    Immunization History  Administered Date(s) Administered  . PFIZER([place])SARS-COV-2 Vaccination 01/22/2020, 02/07/2020  . Td 10/26/1990  . Zoster Recombinat (Shingrix) 09/12/2018, 05/18/2019     Objective: Vital Signs: BP 113/74 (BP Location: Left Arm, Patient Position: Sitting, Cuff  Size: Normal)   Pulse 73   Ht 5' 3.5" (1.613 m)   Wt 130 lb 3.2 oz (59.1 kg)   LMP 11/13/2011   BMI 22.70 kg/m    Physical Exam Vitals and nursing note reviewed.  Constitutional:      Appearance: She is well-developed.  HENT:     Head: Normocephalic and atraumatic.  Eyes:     Conjunctiva/sclera: Conjunctivae normal.  Cardiovascular:     Rate and Rhythm: Normal rate and regular rhythm.     Heart sounds: Normal heart sounds.  Pulmonary:     Effort: Pulmonary effort is normal.     Breath sounds: Normal breath sounds.  Abdominal:     General: Bowel sounds are normal.     Palpations: Abdomen is soft.  Musculoskeletal:     Cervical back: Normal range of motion.  Lymphadenopathy:     Cervical: No cervical adenopathy.  Skin:    General: Skin is warm and dry.     Capillary Refill: Capillary refill takes less than 2 seconds.  Neurological:     Mental Status: She is alert and oriented to person, place, and time.  Psychiatric:        Behavior: Behavior normal.      Musculoskeletal Exam: C-spine thoracic and lumbar spine were in good range of motion.  Shoulder joints, elbow joints, wrist joints, MCPs PIPs and DIPs with good range of motion with no synovitis.  Hip joints, knee joints, ankles, MTPs and PIPs with good range of motion with no synovitis.    CDAI Exam: CDAI Score: 0  Patient Global: 0 mm; Provider Global: 0 mm Swollen: 0 ; Tender: 0  Joint Exam 03/04/2021   No joint exam has been documented for this visit   There is currently no information documented on the homunculus. Go to the Rheumatology activity and complete the homunculus joint exam.  Investigation: No additional findings.  Imaging: No results found.  Recent Labs: Lab Results  Component Value Date   WBC 5.0 02/21/2021   HGB 13.3 02/21/2021   PLT 238 02/21/2021   NA 140 02/21/2021   K 4.3 02/21/2021   CL 103 02/21/2021   CO2 29 02/21/2021   GLUCOSE 114 (H) 02/21/2021   BUN 9 02/21/2021    CREATININE 0.69 02/21/2021   BILITOT 0.4 02/21/2021   ALKPHOS 39 11/27/2019   AST 25 02/21/2021   ALT 23 02/21/2021   PROT 6.1 02/21/2021   ALBUMIN 4.7 11/27/2019   CALCIUM 10.1 02/21/2021   GFRAA 112 02/21/2021    Speciality Comments: PLQ eye exam: 06/26/2020 WNL  Dr. Lester [address]. Follow up in 12 months.  Procedures:  No procedures performed Allergies: Patient has no known allergies.   Assessment / Plan:     Visit Diagnoses: Seronegative rheumatoid arthritis (HCC)-her rheumatoid arthritis is well controlled without any synovitis on my examination today.  She has not had a rheumatoid arthritis flare in a long time.  She has been taking Plaquenil 200 mg every other day since the last visit.  We discussed stopping Plaquenil but she is hesitant to stop Plaquenil.  High  risk medication use - Plaquenil 200 mg 1 tablet every other day. PLQ eye exam: 06/26/2020.  She is scheduled to have repeat eye examination in September.  Labs from February 21, 2021 were reviewed which were within normal limits.  She will have repeat labs in 5 months.  Primary osteoarthritis of both knees-she has some discomfort in her knee joints only with certain activities.  No warmth swelling or effusion was noted.  Osteopenia of multiple sites - DEXA: 12/13/2020 T-score: -1.9, BMD: 0.637 right femoral neck. DEXA on 12/12/18: total left hip BMD 0.715 with T-score of -1.9.  No significant change in BMD.  DEXA results were discussed at length.  Use of calcium with vitamin D and resistive exercises were discussed.  Her vitamin D in December 2021 were upper limits of normal.  Have advised her to cut back on vitamin D intake.  Chronic nonintractable headache, unspecified headache type  Madarosis of eyelid, unspecified laterality  Malignant neoplasm of upper-outer quadrant of left breast in female, estrogen receptor positive ([place]) - dxd 02/21 mastectomy and RTX, no CTX  Orders: No orders of the defined types were placed in this  encounter.  No orders of the defined types were placed in this encounter.     Follow-Up Instructions: Return in about 5 months (around 08/04/2021) for Rheumatoid arthritis.   Bo Merino, MD  Note - This record has been created using Editor, commissioning.  Chart creation errors have been sought, but may not always  have been located. Such creation errors do not reflect on  the standard of medical care.

## 2021-02-21 ENCOUNTER — Other Ambulatory Visit: Payer: Self-pay

## 2021-02-21 DIAGNOSIS — R5383 Other fatigue: Secondary | ICD-10-CM | POA: Diagnosis not present

## 2021-02-21 DIAGNOSIS — Z79899 Other long term (current) drug therapy: Secondary | ICD-10-CM

## 2021-02-22 LAB — IRON,TIBC AND FERRITIN PANEL
%SAT: 26 % (calc) (ref 16–45)
Ferritin: 48 ng/mL (ref 16–232)
Iron: 64 ug/dL (ref 45–160)
TIBC: 247 mcg/dL (calc) — ABNORMAL LOW (ref 250–450)

## 2021-02-22 LAB — CBC WITH DIFFERENTIAL/PLATELET
Absolute Monocytes: 290 cells/uL (ref 200–950)
Basophils Absolute: 30 cells/uL (ref 0–200)
Basophils Relative: 0.6 %
Eosinophils Absolute: 100 cells/uL (ref 15–500)
Eosinophils Relative: 2 %
HCT: 38 % (ref 35.0–45.0)
Hemoglobin: 13.3 g/dL (ref 11.7–15.5)
Lymphs Abs: 1595 cells/uL (ref 850–3900)
MCH: 30.6 pg (ref 27.0–33.0)
MCHC: 35 g/dL (ref 32.0–36.0)
MCV: 87.6 fL (ref 80.0–100.0)
MPV: 9.7 fL (ref 7.5–12.5)
Monocytes Relative: 5.8 %
Neutro Abs: 2985 cells/uL (ref 1500–7800)
Neutrophils Relative %: 59.7 %
Platelets: 238 10*3/uL (ref 140–400)
RBC: 4.34 10*6/uL (ref 3.80–5.10)
RDW: 11.6 % (ref 11.0–15.0)
Total Lymphocyte: 31.9 %
WBC: 5 10*3/uL (ref 3.8–10.8)

## 2021-02-22 LAB — COMPLETE METABOLIC PANEL WITH GFR
AG Ratio: 2.2 (calc) (ref 1.0–2.5)
ALT: 23 U/L (ref 6–29)
AST: 25 U/L (ref 10–35)
Albumin: 4.2 g/dL (ref 3.6–5.1)
Alkaline phosphatase (APISO): 48 U/L (ref 37–153)
BUN: 9 mg/dL (ref 7–25)
CO2: 29 mmol/L (ref 20–32)
Calcium: 10.1 mg/dL (ref 8.6–10.4)
Chloride: 103 mmol/L (ref 98–110)
Creat: 0.69 mg/dL (ref 0.50–1.05)
GFR, Est African American: 112 mL/min/{1.73_m2} (ref >=60)
GFR, Est Non African American: 97 mL/min/{1.73_m2} (ref >=60)
Globulin: 1.9 g/dL (calc) (ref 1.9–3.7)
Glucose, Bld: 114 mg/dL — ABNORMAL HIGH (ref 65–99)
Potassium: 4.3 mmol/L (ref 3.5–5.3)
Sodium: 140 mmol/L (ref 135–146)
Total Bilirubin: 0.4 mg/dL (ref 0.2–1.2)
Total Protein: 6.1 g/dL (ref 6.1–8.1)

## 2021-02-24 NOTE — Progress Notes (Signed)
CBC WNL. Glucose is 114. Rest of CMP WNL.  TIBC is borderline low.  Iron and ferritin WNL. We will continue to monitor.  Please forward lab work to PCP.

## 2021-03-04 ENCOUNTER — Encounter: Payer: Self-pay | Admitting: Rheumatology

## 2021-03-04 ENCOUNTER — Ambulatory Visit: Payer: BC Managed Care – PPO | Admitting: Rheumatology

## 2021-03-04 ENCOUNTER — Other Ambulatory Visit: Payer: Self-pay

## 2021-03-04 VITALS — BP 113/74 | HR 73 | Ht 63.5 in | Wt 130.2 lb

## 2021-03-04 DIAGNOSIS — M8589 Other specified disorders of bone density and structure, multiple sites: Secondary | ICD-10-CM | POA: Diagnosis not present

## 2021-03-04 DIAGNOSIS — M0600 Rheumatoid arthritis without rheumatoid factor, unspecified site: Secondary | ICD-10-CM

## 2021-03-04 DIAGNOSIS — H02729 Madarosis of unspecified eye, unspecified eyelid and periocular area: Secondary | ICD-10-CM

## 2021-03-04 DIAGNOSIS — R519 Headache, unspecified: Secondary | ICD-10-CM

## 2021-03-04 DIAGNOSIS — Z79899 Other long term (current) drug therapy: Secondary | ICD-10-CM | POA: Diagnosis not present

## 2021-03-04 DIAGNOSIS — Z170 Estrogen receptor positive status [ER+]: Secondary | ICD-10-CM

## 2021-03-04 DIAGNOSIS — E559 Vitamin D deficiency, unspecified: Secondary | ICD-10-CM

## 2021-03-04 DIAGNOSIS — M170 Bilateral primary osteoarthritis of knee: Secondary | ICD-10-CM

## 2021-03-04 DIAGNOSIS — M85852 Other specified disorders of bone density and structure, left thigh: Secondary | ICD-10-CM

## 2021-03-04 DIAGNOSIS — C50412 Malignant neoplasm of upper-outer quadrant of left female breast: Secondary | ICD-10-CM

## 2021-03-04 DIAGNOSIS — G8929 Other chronic pain: Secondary | ICD-10-CM

## 2021-03-04 NOTE — Patient Instructions (Signed)
Standing Labs We placed an order today for your standing lab work.   Please have your standing labs drawn in September  If possible, please have your labs drawn 2 weeks prior to your appointment so that the provider can discuss your results at your appointment.  We have open lab daily Monday through Thursday from 1:30-4:30 PM and Friday from 1:30-4:00 PM at the office of Dr. Bo Merino, [facility].   Please be advised, all patients with office appointments requiring lab work will take precedents over walk-in lab work.  If possible, please come for your lab work on Monday and Friday afternoons, as you may experience shorter wait times. The office is located at [address] [place], [place], [place] [address] No appointment is necessary.   Labs are drawn by [facility]. Please bring your co-pay at the time of your lab draw.  You may receive a bill from [facility] for your lab work.  If you wish to have your labs drawn at another location, please call the office 24 hours in advance to send orders.  If you have any questions regarding directions or hours of operation,  please call [phone].   As a reminder, please drink plenty of water prior to coming for your lab work. Thanks!  Vaccines You are taking a medication(s) that can suppress your immune system.  The following immunizations are recommended: . Flu annually . Covid-19  . Pneumonia (Pneumovax 23 and Prevnar 13 spaced at least 1 year apart) . Shingrix (after age 56)  Please check with your PCP to make sure you are up to date.

## 2021-03-12 DIAGNOSIS — Z124 Encounter for screening for malignant neoplasm of cervix: Secondary | ICD-10-CM | POA: Diagnosis not present

## 2021-03-12 DIAGNOSIS — Z01419 Encounter for gynecological examination (general) (routine) without abnormal findings: Secondary | ICD-10-CM | POA: Diagnosis not present

## 2021-03-12 DIAGNOSIS — Z6822 Body mass index (BMI) 22.0-22.9, adult: Secondary | ICD-10-CM | POA: Diagnosis not present

## 2021-03-12 DIAGNOSIS — Z01411 Encounter for gynecological examination (general) (routine) with abnormal findings: Secondary | ICD-10-CM | POA: Diagnosis not present

## 2021-06-18 DIAGNOSIS — Z1283 Encounter for screening for malignant neoplasm of skin: Secondary | ICD-10-CM | POA: Diagnosis not present

## 2021-06-18 DIAGNOSIS — L570 Actinic keratosis: Secondary | ICD-10-CM | POA: Diagnosis not present

## 2021-06-18 DIAGNOSIS — B078 Other viral warts: Secondary | ICD-10-CM | POA: Diagnosis not present

## 2021-06-18 DIAGNOSIS — X32XXXD Exposure to sunlight, subsequent encounter: Secondary | ICD-10-CM | POA: Diagnosis not present

## 2021-06-18 DIAGNOSIS — L821 Other seborrheic keratosis: Secondary | ICD-10-CM | POA: Diagnosis not present

## 2021-06-18 DIAGNOSIS — D225 Melanocytic nevi of trunk: Secondary | ICD-10-CM | POA: Diagnosis not present

## 2021-07-14 DIAGNOSIS — Z79899 Other long term (current) drug therapy: Secondary | ICD-10-CM | POA: Diagnosis not present

## 2021-09-22 DIAGNOSIS — Z170 Estrogen receptor positive status [ER+]: Secondary | ICD-10-CM | POA: Diagnosis not present

## 2021-09-22 DIAGNOSIS — C50412 Malignant neoplasm of upper-outer quadrant of left female breast: Secondary | ICD-10-CM | POA: Diagnosis not present

## 2021-09-25 ENCOUNTER — Other Ambulatory Visit: Payer: Self-pay | Admitting: Physician Assistant

## 2021-09-25 DIAGNOSIS — M0600 Rheumatoid arthritis without rheumatoid factor, unspecified site: Secondary | ICD-10-CM

## 2021-09-25 DIAGNOSIS — Z79899 Other long term (current) drug therapy: Secondary | ICD-10-CM

## 2021-09-25 NOTE — Telephone Encounter (Signed)
Next Visit: 10/08/2021  Last Visit: 03/04/2021  Labs: 02/21/2021 CBC WNL. Glucose is 114. Rest of CMP WNL.  TIBC is borderline low.  Iron and ferritin WNL.   Eye exam: 06/26/2020   Current Dose per office note 03/04/2021:  Plaquenil 200 mg 1 tablet every other day  [id number] rheumatoid arthritis   Last Fill: 02/10/2021  Patient advised she is due to update her labs and PLQ eye exam. Patient states she updated her eye exam and will have the eye doctor send the results. Patient states she will come prior to her appointment and update labs.   Okay to refill Plaquenil?

## 2021-09-26 NOTE — Progress Notes (Signed)
Office Visit Note  Patient: Stacey Craig             Date of Birth: Mar 22, 1961           MRN: [id number]             PCP: Hoyt Koch, MD Referring: Hoyt Koch, * Visit Date: 10/08/2021 Occupation: @GUAROCC @  Subjective:  Discuss plaquenil dose  History of Present Illness: Stacey Craig is a 60 y.o. female with history of seronegative rheumatoid arthritis and osteoarthritis.  Patient reports that last week she experienced increased pain and stiffness in both hands lasting 3 days.  She states that she was current concerned her rheumatoid arthritis was flaring so she increased her dose of Plaquenil from 100 mg every day (half tablet) to 200 mg every day (1 tablet) since then.  She denies any joint swelling during that time.  She is Voltaren gel topically as needed for pain relief and took Tylenol as needed.  Her symptoms have completely resolved.  She denies any other joint pain or joint swelling during that episode.  She is unsure if she should reduce the dose of Plaquenil back to her maintenance dose or continue on the higher dose.  She remains active walking on a daily basis for exercise and weightlifting 4 days a week.     Activities of Daily Living:  Patient reports morning stiffness for 0 minutes.   Patient Denies nocturnal pain.  Difficulty dressing/grooming: Denies Difficulty climbing stairs: Denies Difficulty getting out of chair: Denies Difficulty using hands for taps, buttons, cutlery, and/or writing: Denies  Review of Systems  Constitutional:  Negative for fatigue.  HENT:  Negative for mouth sores, mouth dryness and nose dryness.   Eyes:  Negative for pain, itching and dryness.  Respiratory:  Negative for shortness of breath and difficulty breathing.   Cardiovascular:  Negative for chest pain and palpitations.  Gastrointestinal:  Negative for blood in stool, constipation and diarrhea.  Endocrine: Negative for increased urination.   Genitourinary:  Negative for difficulty urinating.  Musculoskeletal:  Positive for joint pain and joint pain. Negative for joint swelling, myalgias, morning stiffness, muscle tenderness and myalgias.  Skin:  Positive for rash. Negative for color change.  Allergic/Immunologic: Negative for susceptible to infections.  Neurological:  Negative for dizziness, numbness, headaches, memory loss and weakness.  Hematological:  Positive for bruising/bleeding tendency.  Psychiatric/Behavioral:  Negative for confusion.    PMFS History:  Patient Active Problem List   Diagnosis Date Noted   Word finding difficulty 06/06/2020   Memory loss 06/06/2020   GERD (gastroesophageal reflux disease) 02/15/2020   Malignant neoplasm of upper-outer quadrant of left breast in female, estrogen receptor positive ([place]) 12/20/2019   Routine general medical examination at a health care facility 09/12/2018   Chronic headaches 11/03/2017   Rheumatoid arthritis ([place]) 06/21/2015    Past Medical History:  Diagnosis Date   Arthritis    Breast cancer ([place])    Colon polyps    Genital warts    UTI (lower urinary tract infection)     Family History  Problem Relation Age of Onset   Cancer Father        brain, bladder, and skin   Heart disease Father    Stroke Father    Cancer Maternal Grandmother        breast   Cancer Paternal Grandmother        lung   Cancer Paternal Grandfather  bone   Past Surgical History:  Procedure Laterality Date   AUGMENTATION MAMMAPLASTY Bilateral    BREAST SURGERY     FOREARM SURGERY Left    SKIN SURGERY  03/2020   chest, back    Social History   Social History Narrative   Right handed    Caffeine- 6 cups daily    Immunization History  Administered Date(s) Administered   PFIZER(Purple Top)SARS-COV-2 Vaccination 01/22/2020, 02/07/2020   Td 10/26/1990   Zoster Recombinat (Shingrix) 09/12/2018, 05/18/2019     Objective: Vital Signs: BP 104/73 (BP Location: Left Arm,  Patient Position: Sitting, Cuff Size: Normal)   Pulse 79   Ht 5' 3.5" (1.613 m)   Wt 127 lb 3.2 oz (57.7 kg)   LMP 11/13/2011   BMI 22.18 kg/m    Physical Exam Vitals and nursing note reviewed.  Constitutional:      Appearance: She is well-developed.  HENT:     Head: Normocephalic and atraumatic.  Eyes:     Conjunctiva/sclera: Conjunctivae normal.  Pulmonary:     Effort: Pulmonary effort is normal.  Abdominal:     Palpations: Abdomen is soft.  Musculoskeletal:     Cervical back: Normal range of motion.  Skin:    General: Skin is warm and dry.     Capillary Refill: Capillary refill takes less than 2 seconds.  Neurological:     Mental Status: She is alert and oriented to person, place, and time.  Psychiatric:        Behavior: Behavior normal.     Musculoskeletal Exam: C-spine, thoracic spine, lumbar spine have good range of motion with no discomfort.  Shoulder joints, elbow joints, wrist joints, MCPs, PIPs, DIPs have good range of motion with no synovitis.  Complete fist formation bilaterally.  PIP and DIP thickening consistent with osteoarthritis of both hands.  Hip joints have good range of motion with no groin pain.  No tenderness over trochanteric bursa bilaterally.  Knee joints have good range of motion with no warmth or effusion.  Ankle joints have good range of motion with no tenderness or joint swelling.  CDAI Exam: CDAI Score: 0.2  Patient Global: 1 mm; Provider Global: 1 mm Swollen: 0 ; Tender: 0  Joint Exam 10/08/2021   No joint exam has been documented for this visit   There is currently no information documented on the homunculus. Go to the Rheumatology activity and complete the homunculus joint exam.  Investigation: No additional findings.  Imaging: No results found.  Recent Labs: Lab Results  Component Value Date   WBC 4.8 10/03/2021   HGB 13.6 10/03/2021   PLT 190 10/03/2021   NA 140 10/03/2021   K 4.0 10/03/2021   CL 103 10/03/2021   CO2 29  10/03/2021   GLUCOSE 96 10/03/2021   BUN 13 10/03/2021   CREATININE 0.78 10/03/2021   BILITOT 0.6 10/03/2021   ALKPHOS 39 11/27/2019   AST 14 10/03/2021   ALT 11 10/03/2021   PROT 6.0 (L) 10/03/2021   ALBUMIN 4.7 11/27/2019   CALCIUM 9.4 10/03/2021   GFRAA 112 02/21/2021    Speciality Comments: PLQ eye exam: 06/26/2020 WNL  Dr. Lester [address]. Follow up in 12 months.  Procedures:  No procedures performed Allergies: Patient has no known allergies.   Assessment / Plan:     Visit Diagnoses: Seronegative rheumatoid arthritis ([place]): She has no joint tenderness or synovitis on examination today.  Last week she was experiencing increased pain and stiffness in both hands lasting 3  days.  She did not notice any visible swelling during that time.  She was unable to identify a trigger for the increase in her symptoms.  She had been taking Plaquenil 200 mg half tablet by mouth daily and has not missed any doses.  She was concerned that she was having a rheumatoid arthritis flare so she increased the dose of Plaquenil to 200 mg 1 tablet daily which she has continued through today's visit.  She also use Voltaren gel topically as needed for pain relief and tried Tylenol as needed.  Her symptoms completely resolved after 3 days.  She is not experiencing any other joint pain or inflammation at this time.  She is not experiencing any morning stiffness or nocturnal pain.  She remains active walking on a daily basis and weightlifting 4 days a week.  Discussed that she can try going back to her maintenance dose of Plaquenil 200 mg half tablet daily and to monitor symptoms closely.  If she continues to have recurrent flares we can try increasing the dose of Plaquenil in the future.  I offered to check a sed rate and hydroxychloroquine level today but she declined.  Future orders were placed today in case her symptoms return and she would like further evaluation.  A new prescription for Plaquenil was sent to the pharmacy  today.  She was advised to notify us if she develops signs or symptoms of recurrent flares.  She will follow-up in the office in 5 months.  High risk medication use - Plaquenil 200 mg half tablet by mouth daily.   PLQ eye exam: 06/26/2020 WNL Dr. Elson Areas. Follow up in 12 months.  She updated her plaquenil eye examination in September 2022, so she will have the records sent to our office to review.   CBC and CMP were updated on 10/03/2021.  Future order for hydroxychloroquine level was placed today.   Primary osteoarthritis of both knees: She has good range of motion of both knee joints on examination.  No warmth or effusion was noted.  Osteopenia of multiple sites - DEXA: 12/13/2020 T-score: -1.9, BMD: 0.637 right femoral neck. DEXA on 12/12/18: total left hip BMD 0.715 with T-score of -1.9.  She is taking a calcium and vitamin D supplement on a daily basis.  Other medical conditions are listed as follows:   Chronic nonintractable headache, unspecified headache type  Madarosis of eyelid, unspecified laterality  Malignant neoplasm of upper-outer quadrant of left breast in female, estrogen receptor positive ([place]) - dxd 02/21 mastectomy and RTX, no CTX  Orders: No orders of the defined types were placed in this encounter.  No orders of the defined types were placed in this encounter.     Follow-Up Instructions: Return in about 5 months (around 03/08/2022) for Rheumatoid arthritis.   Ofilia Neas, PA-C  Note - This record has been created using Dragon software.  Chart creation errors have been sought, but may not always  have been located. Such creation errors do not reflect on  the standard of medical care.

## 2021-10-03 ENCOUNTER — Other Ambulatory Visit: Payer: Self-pay | Admitting: *Deleted

## 2021-10-03 DIAGNOSIS — Z79899 Other long term (current) drug therapy: Secondary | ICD-10-CM | POA: Diagnosis not present

## 2021-10-04 LAB — COMPLETE METABOLIC PANEL WITH GFR
AG Ratio: 2.2 (calc) (ref 1.0–2.5)
ALT: 11 U/L (ref 6–29)
AST: 14 U/L (ref 10–35)
Albumin: 4.1 g/dL (ref 3.6–5.1)
Alkaline phosphatase (APISO): 40 U/L (ref 37–153)
BUN: 13 mg/dL (ref 7–25)
CO2: 29 mmol/L (ref 20–32)
Calcium: 9.4 mg/dL (ref 8.6–10.4)
Chloride: 103 mmol/L (ref 98–110)
Creat: 0.78 mg/dL (ref 0.50–1.03)
Globulin: 1.9 g/dL (calc) (ref 1.9–3.7)
Glucose, Bld: 96 mg/dL (ref 65–99)
Potassium: 4 mmol/L (ref 3.5–5.3)
Sodium: 140 mmol/L (ref 135–146)
Total Bilirubin: 0.6 mg/dL (ref 0.2–1.2)
Total Protein: 6 g/dL — ABNORMAL LOW (ref 6.1–8.1)
eGFR: 88 mL/min/{1.73_m2} (ref >=60)

## 2021-10-04 LAB — CBC WITH DIFFERENTIAL/PLATELET
Absolute Monocytes: 360 cells/uL (ref 200–950)
Basophils Absolute: 38 cells/uL (ref 0–200)
Basophils Relative: 0.8 %
Eosinophils Absolute: 139 cells/uL (ref 15–500)
Eosinophils Relative: 2.9 %
HCT: 38.8 % (ref 35.0–45.0)
Hemoglobin: 13.6 g/dL (ref 11.7–15.5)
Lymphs Abs: 1738 cells/uL (ref 850–3900)
MCH: 31.7 pg (ref 27.0–33.0)
MCHC: 35.1 g/dL (ref 32.0–36.0)
MCV: 90.4 fL (ref 80.0–100.0)
MPV: 10.6 fL (ref 7.5–12.5)
Monocytes Relative: 7.5 %
Neutro Abs: 2525 cells/uL (ref 1500–7800)
Neutrophils Relative %: 52.6 %
Platelets: 190 10*3/uL (ref 140–400)
RBC: 4.29 10*6/uL (ref 3.80–5.10)
RDW: 11.9 % (ref 11.0–15.0)
Total Lymphocyte: 36.2 %
WBC: 4.8 10*3/uL (ref 3.8–10.8)

## 2021-10-06 NOTE — Progress Notes (Signed)
CBC WNL.  Total protein is borderline low-6.0.  rest of CMP WNL.

## 2021-10-08 ENCOUNTER — Ambulatory Visit: Payer: BC Managed Care – PPO | Admitting: Physician Assistant

## 2021-10-08 ENCOUNTER — Encounter: Payer: Self-pay | Admitting: Physician Assistant

## 2021-10-08 ENCOUNTER — Other Ambulatory Visit: Payer: Self-pay

## 2021-10-08 VITALS — BP 104/73 | HR 79 | Ht 63.5 in | Wt 127.2 lb

## 2021-10-08 DIAGNOSIS — R519 Headache, unspecified: Secondary | ICD-10-CM

## 2021-10-08 DIAGNOSIS — G8929 Other chronic pain: Secondary | ICD-10-CM

## 2021-10-08 DIAGNOSIS — M8589 Other specified disorders of bone density and structure, multiple sites: Secondary | ICD-10-CM | POA: Diagnosis not present

## 2021-10-08 DIAGNOSIS — M170 Bilateral primary osteoarthritis of knee: Secondary | ICD-10-CM

## 2021-10-08 DIAGNOSIS — H02729 Madarosis of unspecified eye, unspecified eyelid and periocular area: Secondary | ICD-10-CM

## 2021-10-08 DIAGNOSIS — Z79899 Other long term (current) drug therapy: Secondary | ICD-10-CM | POA: Diagnosis not present

## 2021-10-08 DIAGNOSIS — Z170 Estrogen receptor positive status [ER+]: Secondary | ICD-10-CM

## 2021-10-08 DIAGNOSIS — M0600 Rheumatoid arthritis without rheumatoid factor, unspecified site: Secondary | ICD-10-CM | POA: Diagnosis not present

## 2021-10-08 DIAGNOSIS — C50412 Malignant neoplasm of upper-outer quadrant of left female breast: Secondary | ICD-10-CM

## 2021-10-08 MED ORDER — HYDROXYCHLOROQUINE SULFATE 200 MG PO TABS: 200.0000 mg | ORAL_TABLET | ORAL | 2 refills | Status: DC

## 2021-10-18 IMAGING — US US BREAST BX W LOC DEV 1ST LESION IMG BX SPEC US GUIDE*L*
1 series · 13 of 13 positions shown · non-contrast
Comparison: Previous exam(s).
COMPARISON: Previous exam(s).

Addendum:
CLINICAL DATA: 58-year-old female presenting for biopsy of a left
breast mass.

EXAM:
ULTRASOUND GUIDED LEFT BREAST CORE NEEDLE BIOPSY

[Series 1: us breast bx w loc dev 1st lesion img bx spec us g · 0.06mm/px · 13 of 13 slices shown]
[im 1/13]
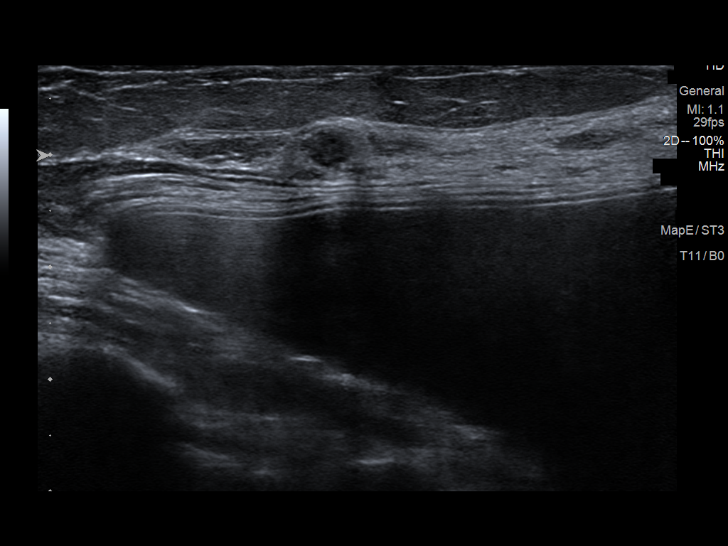
[im 2/13]
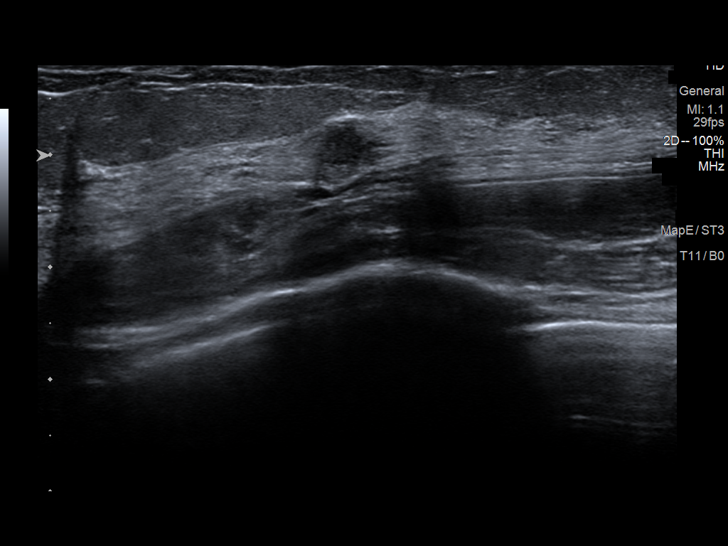
[im 3/13]
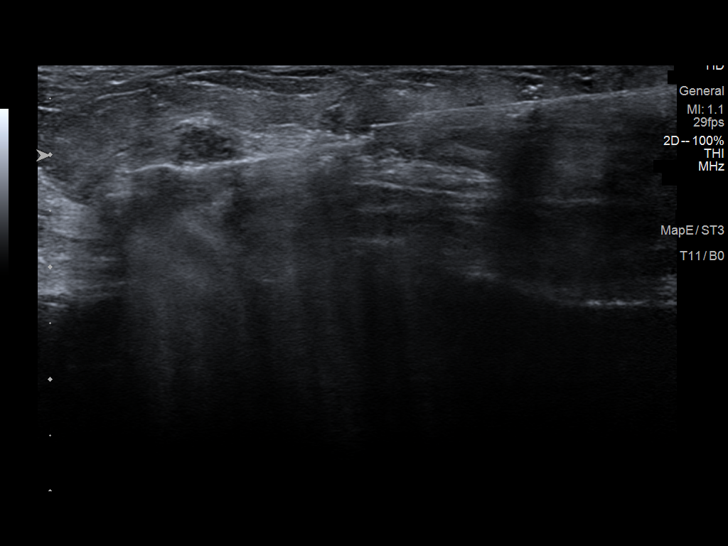
[im 4/13]
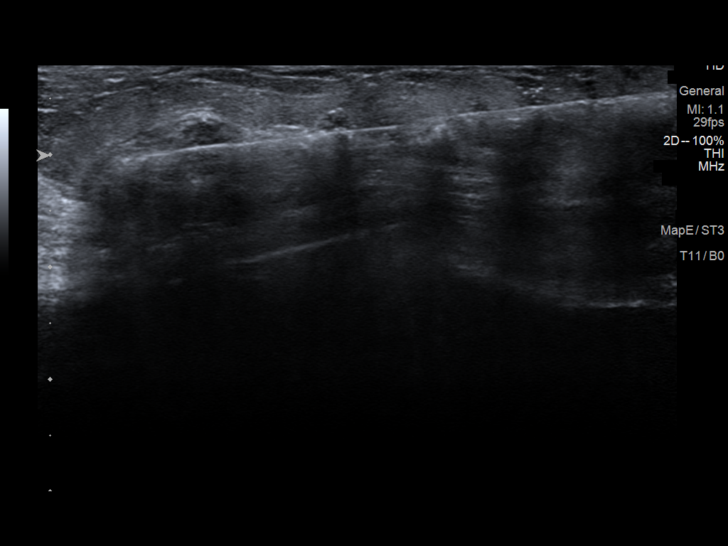
[im 5/13]
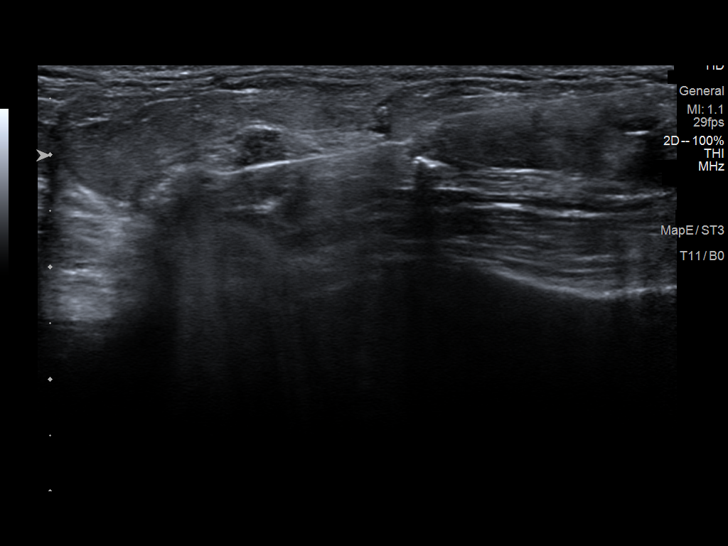
[im 6/13]
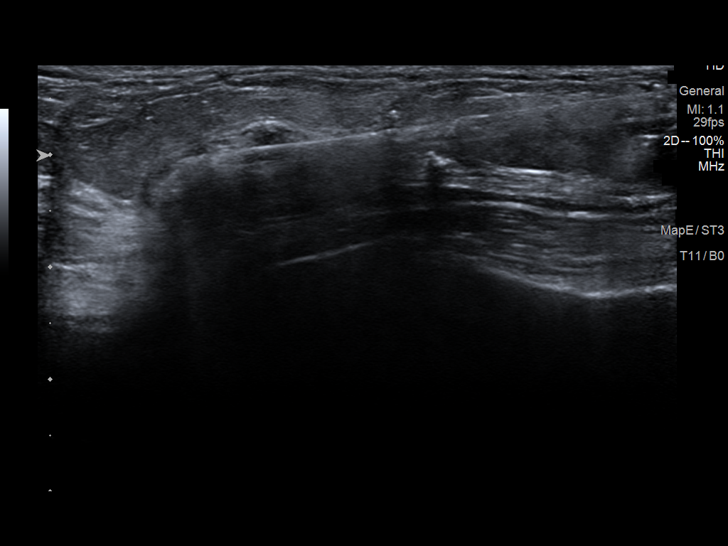
[im 7/13]
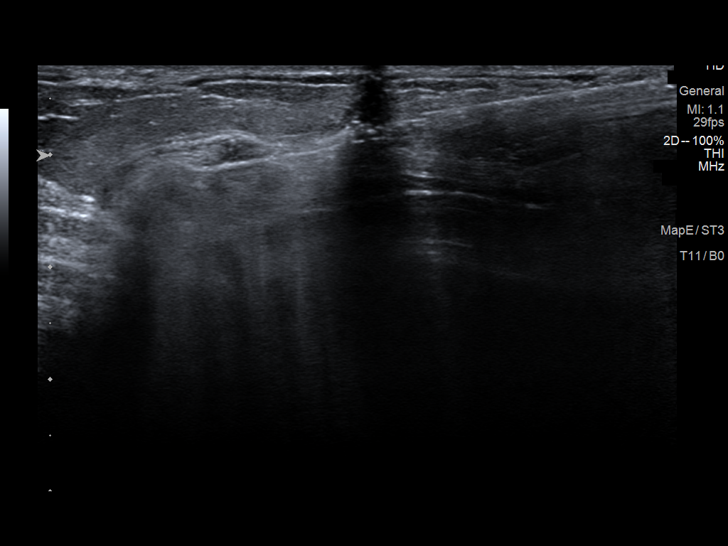
[im 8/13]
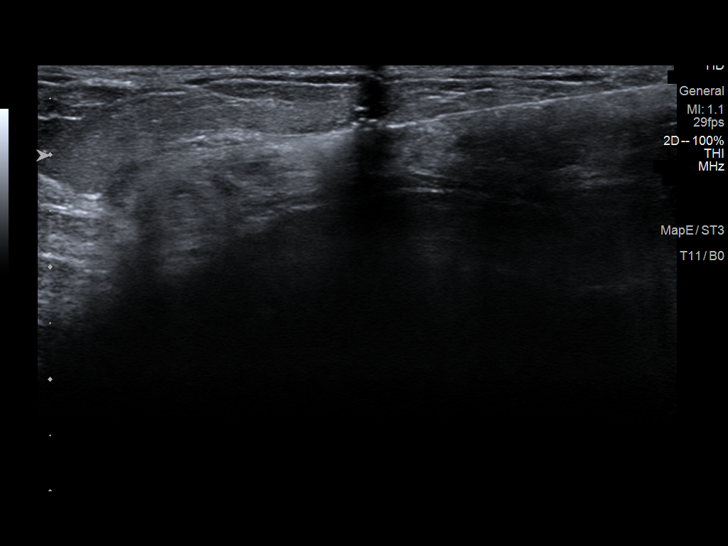
[im 9/13]
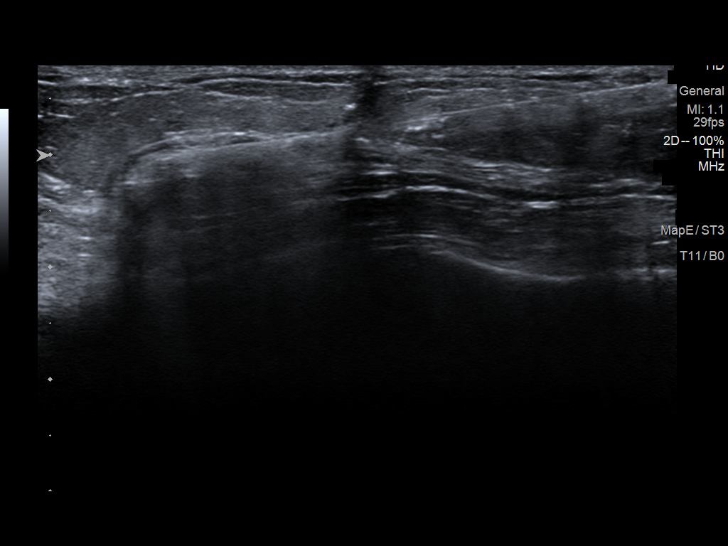
[im 10/13]
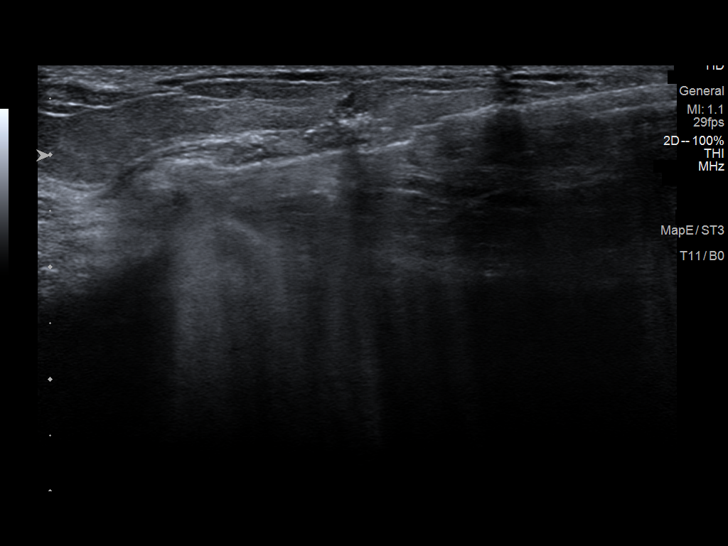
[im 11/13]
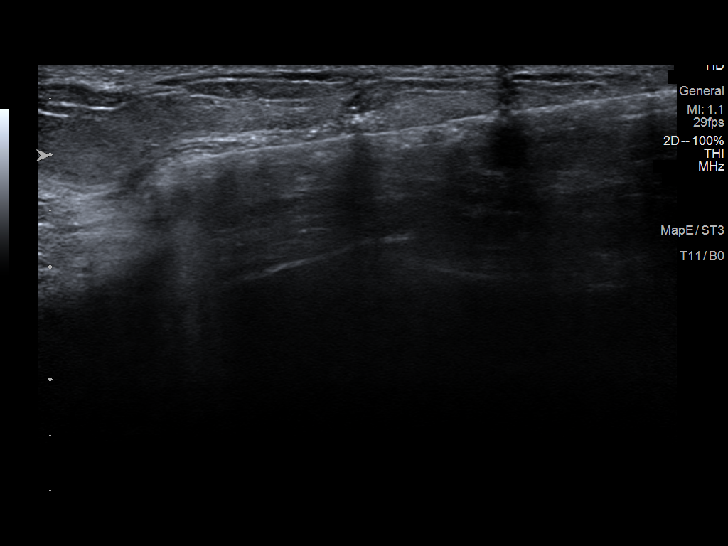
[im 12/13]
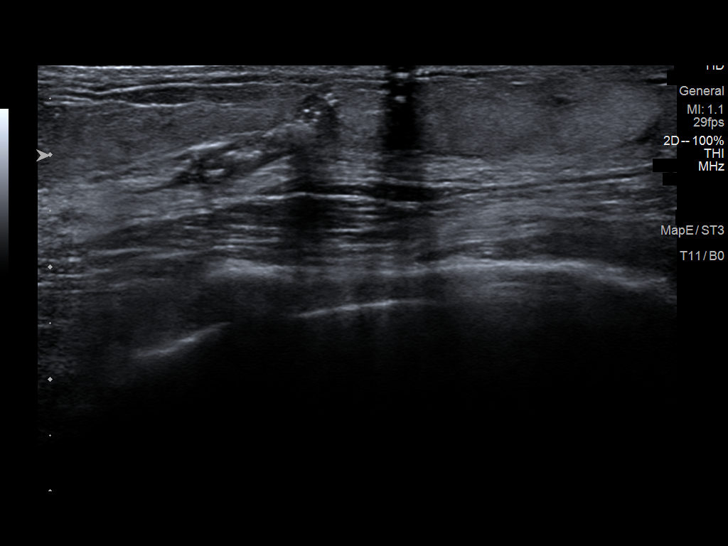
[im 13/13]
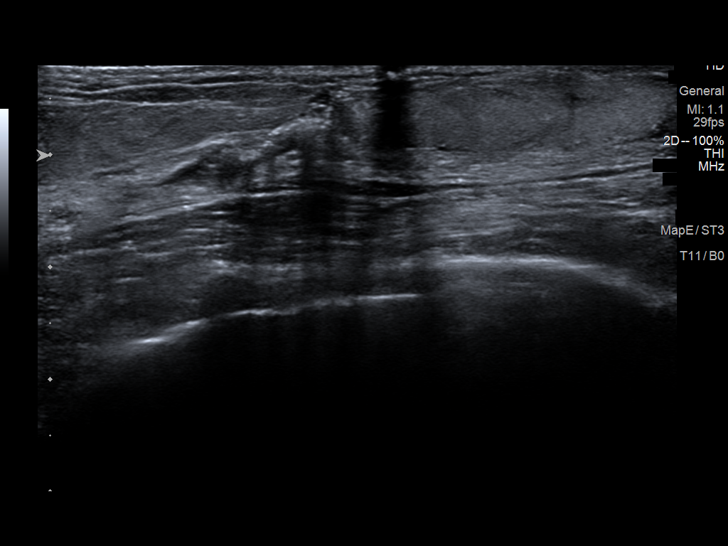

[13 of 13 positions shown; findings below may reference images not displayed]



Lesion quadrant: Upper outer quadrant

Using sterile technique and 1% Lidocaine as local anesthetic, under
direct ultrasound visualization, a 14 gauge Sonia device was
used to perform biopsy of a left breast mass at 12:30 o'clock using
a lateral approach. At the conclusion of the procedure a ribbon
tissue marker clip was deployed into the biopsy cavity. Follow up 2
view mammogram was performed and dictated separately.
IMPRESSION: Ultrasound guided biopsy of a left breast mass at 12:30 o'clock. No
apparent complications.

ADDENDUM:
Pathology revealed GRADE I INVASIVE DUCTAL CARCINOMA, MAMMARY
CARCINOMA IN SITU WITH CALCIFICATIONS of the LEFT breast mass, 12:30
o'clock. This was found to be concordant by Dr. Efremm Bargas.

Pathology results were discussed with the patient by telephone. The
patient reported doing well after the biopsy with tenderness at the
site. Post biopsy instructions and care were reviewed and questions
were answered. The patient was encouraged to call The [REDACTED]

Surgical consultation has been arranged with Dr. Adreana Neuman at
[REDACTED] on December 22, 2019.

Pathology results reported by Bayryam Salvatore RN on 12/15/2019.



Lesion quadrant: Upper outer quadrant

Using sterile technique and 1% Lidocaine as local anesthetic, under
direct ultrasound visualization, a 14 gauge Sonia device was
used to perform biopsy of a left breast mass at 12:30 o'clock using
a lateral approach. At the conclusion of the procedure a ribbon
tissue marker clip was deployed into the biopsy cavity. Follow up 2
view mammogram was performed and dictated separately.
IMPRESSION: Ultrasound guided biopsy of a left breast mass at 12:30 o'clock. No
apparent complications.

## 2021-10-18 IMAGING — MG MM BREAST LOCALIZATION CLIP
4 series · 4 of 12 positions shown · non-contrast
Comparison: Previous exam(s).

CLINICAL DATA: 58-year-old female presenting for biopsy of a left
breast mass.

EXAM:
DIAGNOSTIC LEFT MAMMOGRAM POST ULTRASOUND BIOPSY

[L ML synth-2D]
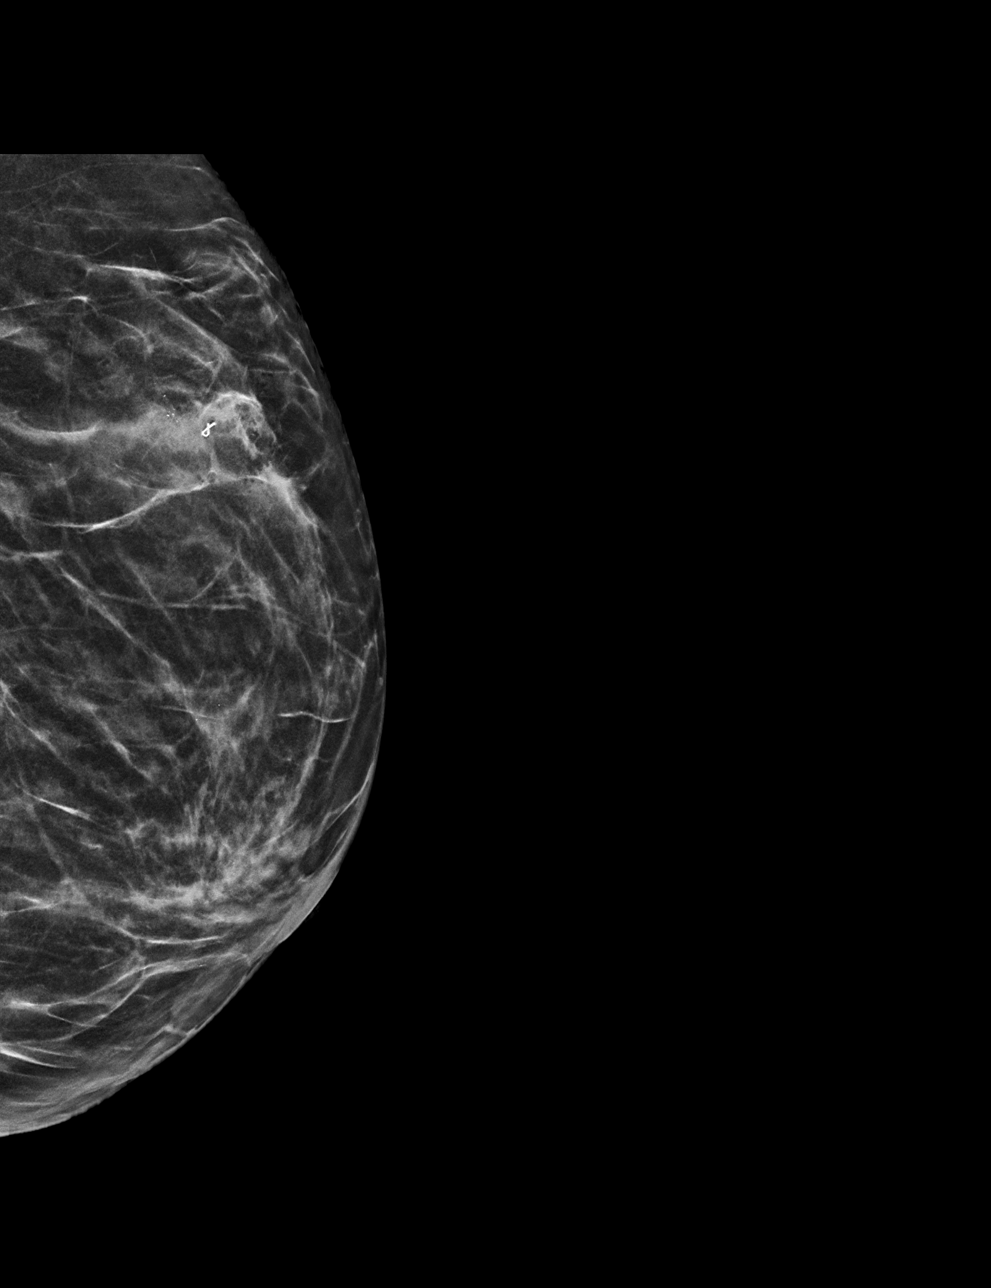

[L CC synth-2D]
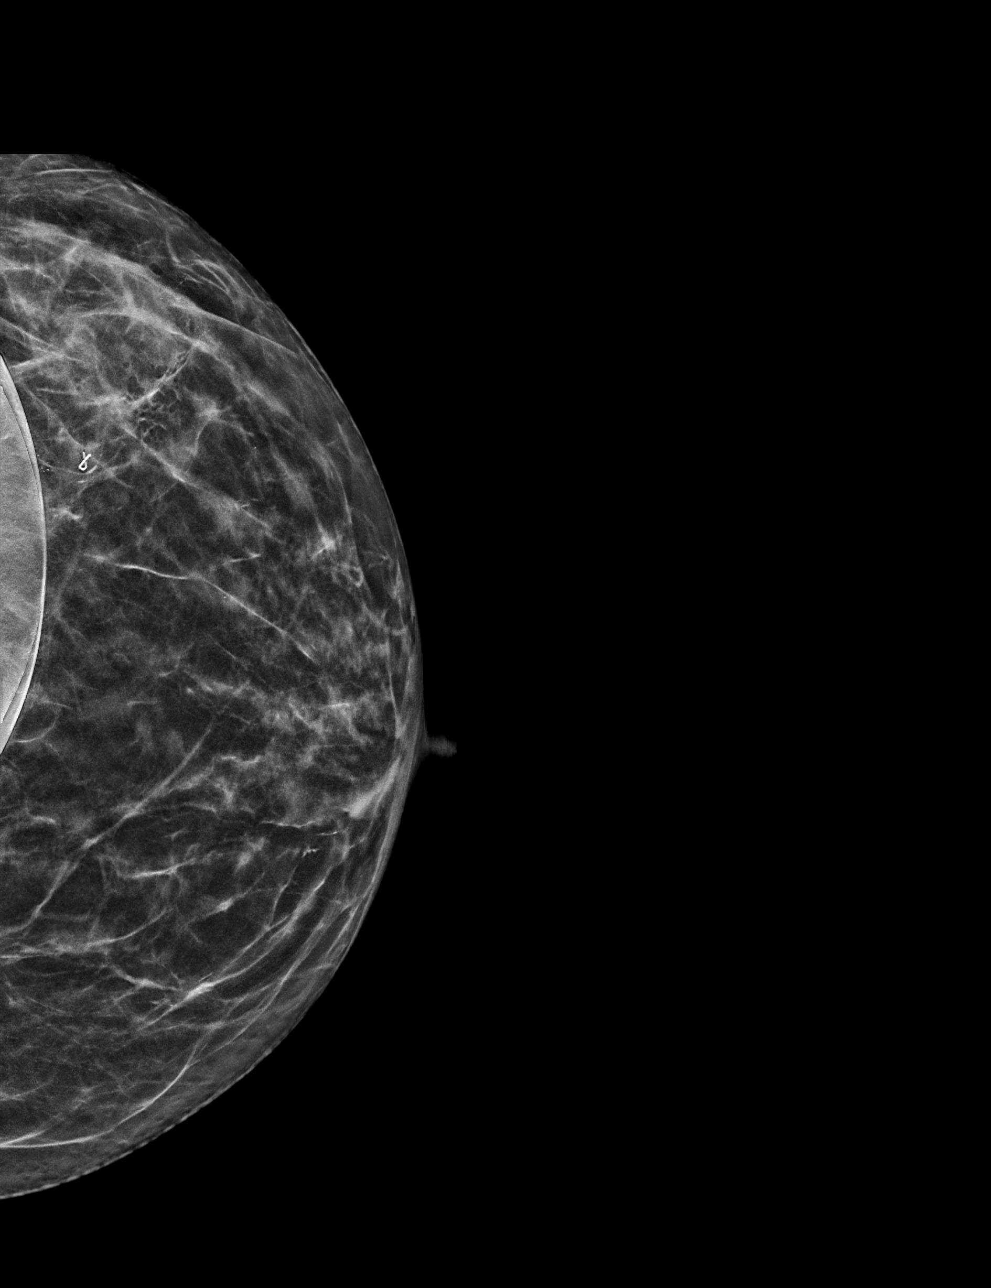

[L CC tomo · tomo slice 25/49.0]
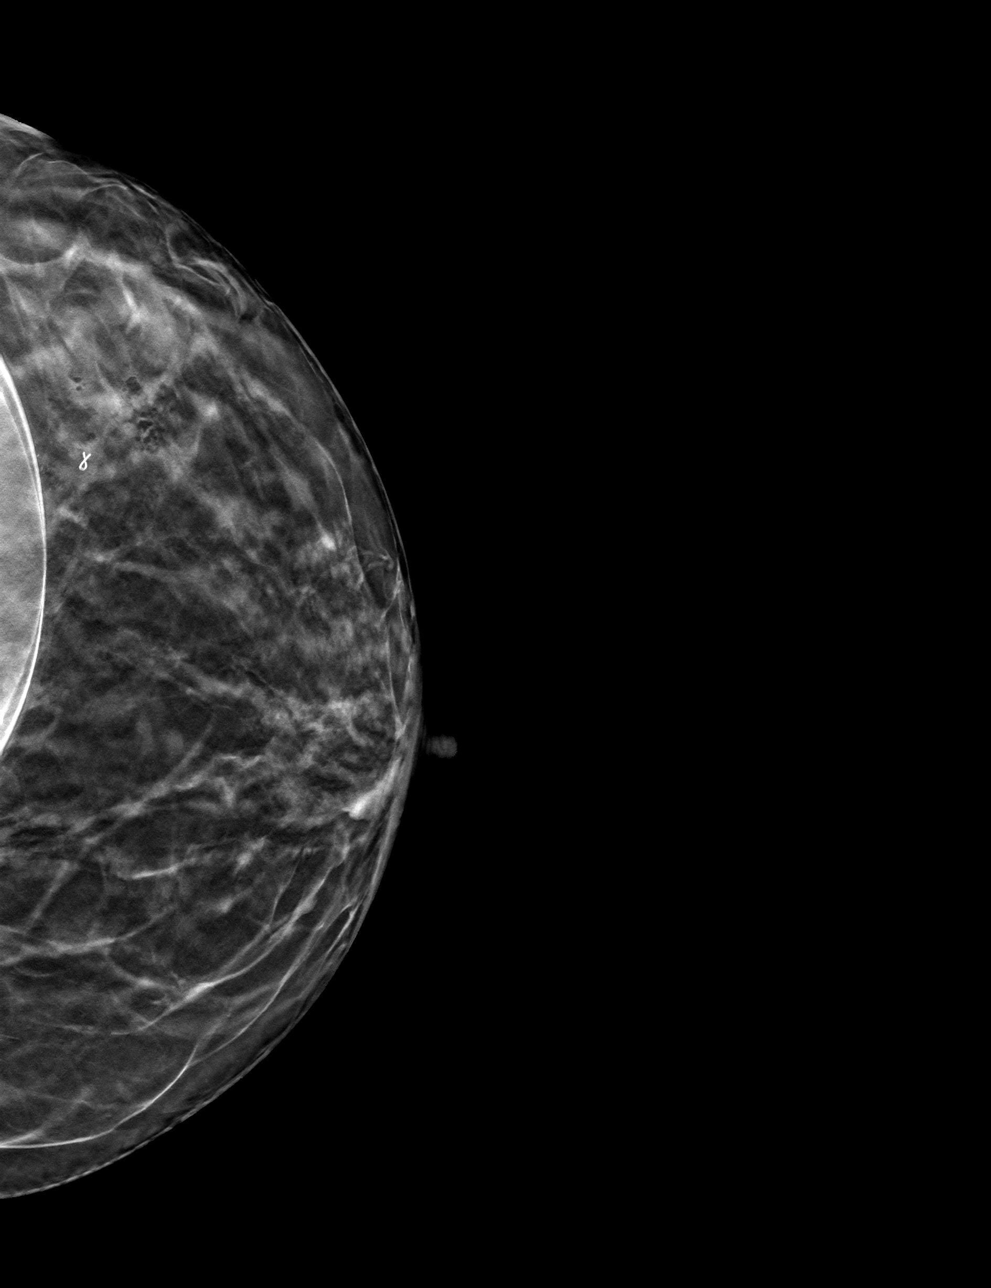

[L ML tomo · tomo slice 22/43.0]
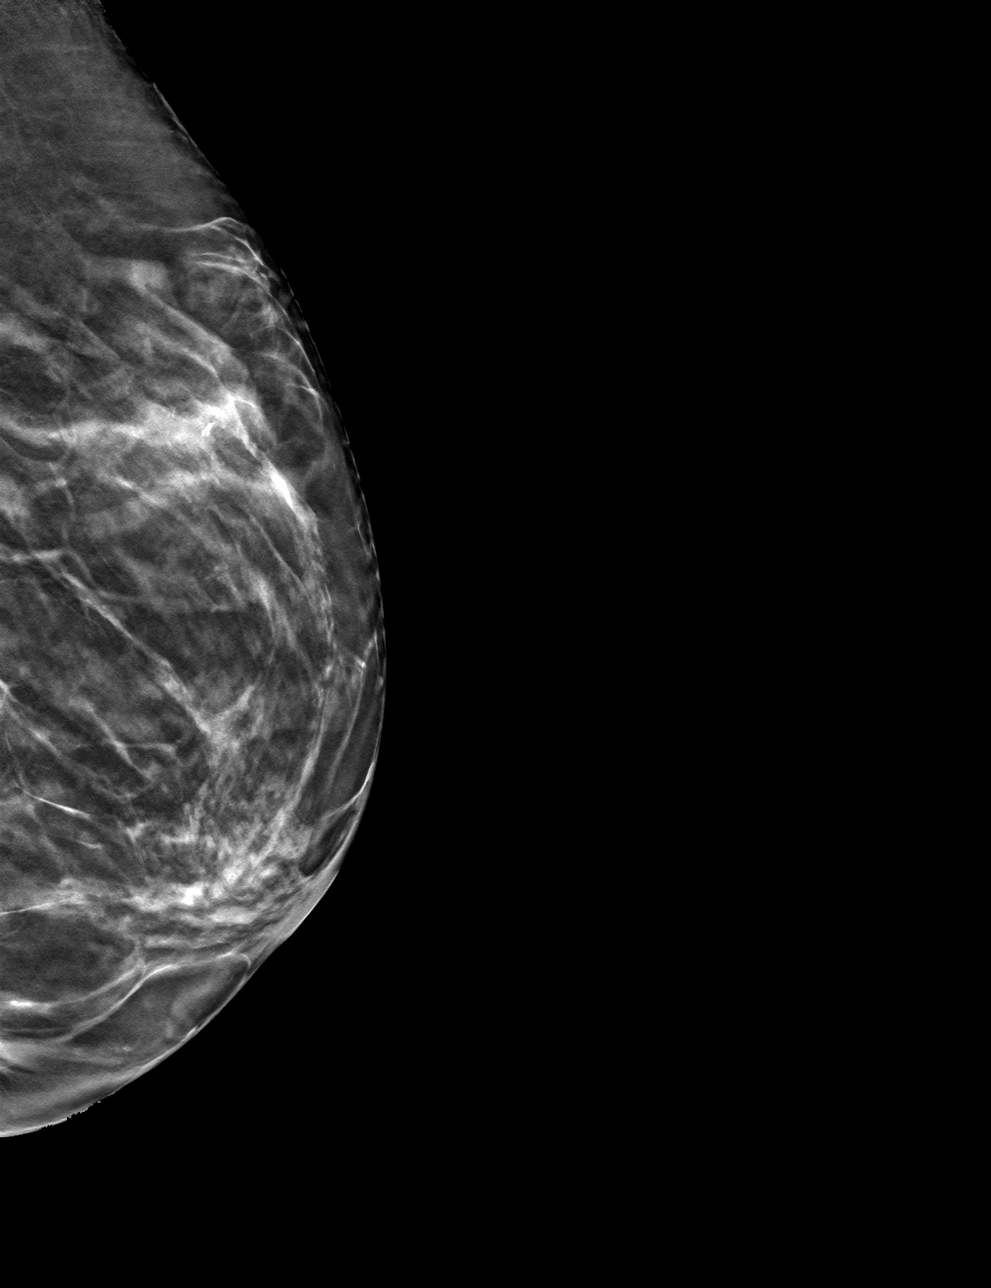

[4 of 12 positions shown; findings below may reference images not displayed]

FINDINGS: Mammographic images were obtained following ultrasound guided biopsy
of a left breast mass at 12:30 o'clock. The biopsy marking clip is
in expected position at the site of biopsy.
IMPRESSION: Appropriate positioning of the ribbon shaped biopsy marking clip at
the site of biopsy in the left breast at 12:30 o'clock.

Final Assessment: Post Procedure Mammograms for Marker Placement

## 2021-11-11 ENCOUNTER — Telehealth: Payer: Self-pay | Admitting: *Deleted

## 2021-11-11 NOTE — Telephone Encounter (Signed)
Patient states she decreased her PLQ in December after her follow up visit as advised. She decreased it to half a tablet. Patient states she has had a flare up that started on Thursday of last week. Patient states it was in her neck and she was unable to move it from side to side. Patient states she tried Tylenol and that did not improve it. Patient states she on Saturday she increased her PLQ back to one tablet daily. Patient states her neck is much improved.

## 2021-11-11 NOTE — Telephone Encounter (Signed)
Okay to stay on Plaquenil 1 tablet daily.

## 2021-11-11 NOTE — Telephone Encounter (Signed)
Patient advised okay to stay on Plaquenil 1 tablet daily.

## 2021-11-13 ENCOUNTER — Encounter: Payer: BC Managed Care – PPO | Admitting: Internal Medicine

## 2021-11-14 ENCOUNTER — Encounter: Payer: BC Managed Care – PPO | Admitting: Internal Medicine

## 2021-11-21 ENCOUNTER — Other Ambulatory Visit: Payer: Self-pay | Admitting: *Deleted

## 2021-11-21 DIAGNOSIS — M0600 Rheumatoid arthritis without rheumatoid factor, unspecified site: Secondary | ICD-10-CM

## 2021-11-21 MED ORDER — HYDROXYCHLOROQUINE SULFATE 200 MG PO TABS: 200.0000 mg | ORAL_TABLET | Freq: Every day | ORAL | 2 refills | Status: DC

## 2021-11-21 NOTE — Telephone Encounter (Signed)
Next Visit: Due May 2023  Last Visit: 10/08/2021  Labs: 10/03/2021 CBC WNL.  Total protein is borderline low-6.0.  rest of CMP WNL.    Eye exam: 07/14/2021 WNL   Current Dose per phone note 11/11/2021: Okay to stay on Plaquenil 1 tablet daily  [id number] rheumatoid arthritis    Okay to refill Plaquenil?

## 2021-12-08 ENCOUNTER — Telehealth: Payer: Self-pay | Admitting: Rheumatology

## 2021-12-08 DIAGNOSIS — Z923 Personal history of irradiation: Secondary | ICD-10-CM | POA: Diagnosis not present

## 2021-12-08 DIAGNOSIS — Z853 Personal history of malignant neoplasm of breast: Secondary | ICD-10-CM | POA: Diagnosis not present

## 2021-12-08 DIAGNOSIS — D0512 Intraductal carcinoma in situ of left breast: Secondary | ICD-10-CM | POA: Diagnosis not present

## 2021-12-08 DIAGNOSIS — Z9889 Other specified postprocedural states: Secondary | ICD-10-CM | POA: Diagnosis not present

## 2021-12-08 DIAGNOSIS — Z1231 Encounter for screening mammogram for malignant neoplasm of breast: Secondary | ICD-10-CM | POA: Diagnosis not present

## 2021-12-08 NOTE — Telephone Encounter (Signed)
Spoke with the pharmacy and they were able to get the prescription to go through so they will get the prescription ready for the patient. Left message to advise patient.

## 2021-12-08 NOTE — Telephone Encounter (Signed)
Patient called the office stating she had called to have her Plaquenil refilled but her Pharmacy is telling her they can't fill it until 2/21. Patient states she is on her last pill and would like someone to figure out why they cant fill it.

## 2021-12-10 ENCOUNTER — Other Ambulatory Visit: Payer: Self-pay

## 2021-12-10 ENCOUNTER — Other Ambulatory Visit: Payer: Self-pay | Admitting: Physician Assistant

## 2021-12-10 ENCOUNTER — Ambulatory Visit (INDEPENDENT_AMBULATORY_CARE_PROVIDER_SITE_OTHER): Payer: BC Managed Care – PPO | Admitting: Internal Medicine

## 2021-12-10 ENCOUNTER — Encounter: Payer: Self-pay | Admitting: Internal Medicine

## 2021-12-10 VITALS — BP 120/70 | HR 75 | Resp 18 | Ht 63.5 in | Wt 124.2 lb

## 2021-12-10 DIAGNOSIS — Z853 Personal history of malignant neoplasm of breast: Secondary | ICD-10-CM

## 2021-12-10 DIAGNOSIS — M0600 Rheumatoid arthritis without rheumatoid factor, unspecified site: Secondary | ICD-10-CM | POA: Diagnosis not present

## 2021-12-10 DIAGNOSIS — Z23 Encounter for immunization: Secondary | ICD-10-CM | POA: Diagnosis not present

## 2021-12-10 DIAGNOSIS — Z13220 Encounter for screening for lipoid disorders: Secondary | ICD-10-CM

## 2021-12-10 DIAGNOSIS — Z0000 Encounter for general adult medical examination without abnormal findings: Secondary | ICD-10-CM | POA: Diagnosis not present

## 2021-12-10 DIAGNOSIS — R4789 Other speech disturbances: Secondary | ICD-10-CM | POA: Diagnosis not present

## 2021-12-10 NOTE — Patient Instructions (Addendum)
We have given you the tetanus shot today.   

## 2021-12-10 NOTE — Assessment & Plan Note (Signed)
Off oral anti-estrogen pills. Under monitoring and recent mammogram normal.

## 2021-12-10 NOTE — Assessment & Plan Note (Signed)
We discussed today, stable. Mom with alzheimer's. She is in some online clinical evaluation.

## 2021-12-10 NOTE — Progress Notes (Signed)
° °  Subjective:   Patient ID: Stacey Craig, female    DOB: 1960-12-05, 61 y.o.   MRN: [id number]  HPI The patient is a 61 YO female coming in for physical.   PMH, [place], social history reviewed and updated  Review of Systems  Constitutional: Negative.   HENT: Negative.    Eyes: Negative.   Respiratory:  Negative for cough, chest tightness and shortness of breath.   Cardiovascular:  Negative for chest pain, palpitations and leg swelling.  Gastrointestinal:  Negative for abdominal distention, abdominal pain, constipation, diarrhea, nausea and vomiting.  Musculoskeletal: Negative.   Skin: Negative.   Neurological: Negative.   Psychiatric/Behavioral: Negative.     Objective:  Physical Exam Constitutional:      Appearance: She is well-developed.  HENT:     Head: Normocephalic and atraumatic.  Cardiovascular:     Rate and Rhythm: Normal rate and regular rhythm.  Pulmonary:     Effort: Pulmonary effort is normal. No respiratory distress.     Breath sounds: Normal breath sounds. No wheezing or rales.  Abdominal:     General: Bowel sounds are normal. There is no distension.     Palpations: Abdomen is soft.     Tenderness: There is no abdominal tenderness. There is no rebound.  Musculoskeletal:     Cervical back: Normal range of motion.  Skin:    General: Skin is warm and dry.  Neurological:     Mental Status: She is alert and oriented to person, place, and time.     Coordination: Coordination normal.    Vitals:   12/10/21 0947  BP: 120/70  Pulse: 75  Resp: 18  SpO2: 95%  Weight: 124 lb 3.2 oz (56.3 kg)  Height: 5' 3.5" (1.613 m)    This visit occurred during the SARS-CoV-2 public health emergency.  Safety protocols were in place, including screening questions prior to the visit, additional usage of staff PPE, and extensive cleaning of exam room while observing appropriate contact time as indicated for disinfecting solutions.   Assessment & Plan:  Tdap given at  visit

## 2021-12-10 NOTE — Assessment & Plan Note (Signed)
Flu shot declines. Covid-19 counseled booster. Shingrix complete. Tetanus given. Colonoscopy up to date. Mammogram up to date, pap smear up to date. Counseled about sun safety and mole surveillance. Counseled about the dangers of distracted driving. Given 10 year screening recommendations.

## 2021-12-10 NOTE — Assessment & Plan Note (Signed)
Seeing rheumatology and still controlled on plaquenil. CBC and CMP up to date. Eye exam up to date.

## 2021-12-25 NOTE — Progress Notes (Signed)
Plaquenil eye exam normal on 07/14/21. Repeat exam in September 2023.

## 2021-12-29 DIAGNOSIS — S92504A Nondisplaced unspecified fracture of right lesser toe(s), initial encounter for closed fracture: Secondary | ICD-10-CM | POA: Diagnosis not present

## 2022-03-06 ENCOUNTER — Other Ambulatory Visit: Payer: Self-pay | Admitting: Physician Assistant

## 2022-03-06 DIAGNOSIS — M0600 Rheumatoid arthritis without rheumatoid factor, unspecified site: Secondary | ICD-10-CM

## 2022-03-06 NOTE — Telephone Encounter (Signed)
Next Visit: 04/09/2022 ?  ?Last Visit: 10/08/2021 ?  ?Labs: 10/03/2021 CBC WNL.  Total protein is borderline low-6.0.  rest of CMP WNL.   ?  ?Eye exam: 07/14/2021 WNL  ?  ?Current Dose per phone note 11/11/2021: Okay to stay on Plaquenil 1 tablet daily ?  ?[id number] rheumatoid arthritis  ?  ?  ?Okay to refill Plaquenil?  ?

## 2022-03-13 DIAGNOSIS — M7581 Other shoulder lesions, right shoulder: Secondary | ICD-10-CM | POA: Diagnosis not present

## 2022-03-26 NOTE — Progress Notes (Signed)
Office Visit Note  Patient: Stacey Craig             Date of Birth: 06/08/1961           MRN: [id number]             PCP: Hoyt Koch, MD Referring: Hoyt Koch, * Visit Date: 04/09/2022 Occupation: '@GUAROCC'$ @  Subjective:  Medication management  History of Present Illness: Stacey Craig is a 61 y.o. female with history of seronegative rheumatoid arthritis and osteoarthritis.  She states she has been doing well without any increased joint pain or joint swelling.  She has been taking hydroxychloroquine 1 tablet daily.  She denies any side effects from hydroxychloroquine.  Her last eye examination was on July 14, 2021.  She has been working out on a regular basis.  She denies any discomfort in her knee joints currently.  Activities of Daily Living:  Patient reports morning stiffness for 0  none .   Patient Denies nocturnal pain.  Difficulty dressing/grooming: Denies Difficulty climbing stairs: Denies Difficulty getting out of chair: Denies Difficulty using hands for taps, buttons, cutlery, and/or writing: Denies  Review of Systems  Constitutional:  Negative for fatigue.  HENT:  Negative for mouth dryness.   Eyes:  Negative for dryness.  Respiratory:  Negative for shortness of breath.   Cardiovascular:  Negative for swelling in legs/feet.  Gastrointestinal:  Negative for constipation.  Endocrine: Negative for excessive thirst.  Genitourinary:  Negative for difficulty urinating.  Musculoskeletal:  Negative for morning stiffness.  Skin:  Negative for rash.  Allergic/Immunologic: Negative for susceptible to infections.  Neurological:  Negative for numbness.  Hematological:  Negative for bruising/bleeding tendency.  Psychiatric/Behavioral:  Negative for sleep disturbance.     PMFS History:  Patient Active Problem List   Diagnosis Date Noted   Word finding difficulty 06/06/2020   HX: breast cancer 12/20/2019   Routine general medical  examination at a health care facility 09/12/2018   Chronic headaches 11/03/2017   Rheumatoid arthritis ([place]) 06/21/2015    Past Medical History:  Diagnosis Date   Arthritis    Breast cancer ([place])    Colon polyps    Genital warts    UTI (lower urinary tract infection)     Family History  Problem Relation Age of Onset   Cancer Father        brain, bladder, and skin   Heart disease Father    Stroke Father    Cancer Maternal Grandmother        breast   Cancer Paternal Grandmother        lung   Cancer Paternal Grandfather        bone   Past Surgical History:  Procedure Laterality Date   AUGMENTATION MAMMAPLASTY Bilateral    BREAST SURGERY     FOREARM SURGERY Left    SKIN SURGERY  03/2020   chest, back    Social History   Social History Narrative   Right handed    Caffeine- 6 cups daily    Immunization History  Administered Date(s) Administered   PFIZER(Purple Top)SARS-COV-2 Vaccination 01/22/2020, 02/07/2020   Td 10/26/1990   Tdap 12/10/2021   Zoster Recombinat (Shingrix) 09/12/2018, 05/18/2019     Objective: Vital Signs: BP 111/76 (BP Location: Left Arm, Patient Position: Sitting, Cuff Size: Small)   Pulse 85   Resp 12   Ht 5' 3.5" (1.613 m)   Wt 124 lb 6.4 oz (56.4 kg)   LMP 11/13/2011  BMI 21.69 kg/m    Physical Exam Vitals and nursing note reviewed.  Constitutional:      Appearance: She is well-developed.  HENT:     Head: Normocephalic and atraumatic.  Eyes:     Conjunctiva/sclera: Conjunctivae normal.  Cardiovascular:     Rate and Rhythm: Normal rate and regular rhythm.     Heart sounds: Normal heart sounds.  Pulmonary:     Effort: Pulmonary effort is normal.     Breath sounds: Normal breath sounds.  Abdominal:     General: Bowel sounds are normal.     Palpations: Abdomen is soft.  Musculoskeletal:     Cervical back: Normal range of motion.  Lymphadenopathy:     Cervical: No cervical adenopathy.  Skin:    General: Skin is warm and dry.      Capillary Refill: Capillary refill takes less than 2 seconds.  Neurological:     Mental Status: She is alert and oriented to person, place, and time.  Psychiatric:        Behavior: Behavior normal.      Musculoskeletal Exam: C-spine, thoracic and lumbar spine were in good range of motion.  Shoulder joints, elbow joints, wrist joints, MCPs PIPs and DIPs with good range of motion.  She had bilateral DIP thickening.  No synovitis was noted.  Hip joints and knee joints in good range of motion.  There was no tenderness over ankles or MTPs.  CDAI Exam: CDAI Score: 0  Patient Global: 0 mm; Provider Global: 0 mm Swollen: 0 ; Tender: 0  Joint Exam 04/09/2022   No joint exam has been documented for this visit   There is currently no information documented on the homunculus. Go to the Rheumatology activity and complete the homunculus joint exam.  Investigation: No additional findings.  Imaging: No results found.  Recent Labs: Lab Results  Component Value Date   WBC 4.8 10/03/2021   HGB 13.6 10/03/2021   PLT 190 10/03/2021   NA 140 10/03/2021   K 4.0 10/03/2021   CL 103 10/03/2021   CO2 29 10/03/2021   GLUCOSE 96 10/03/2021   BUN 13 10/03/2021   CREATININE 0.78 10/03/2021   BILITOT 0.6 10/03/2021   ALKPHOS 39 11/27/2019   AST 14 10/03/2021   ALT 11 10/03/2021   PROT 6.0 (L) 10/03/2021   ALBUMIN 4.7 11/27/2019   CALCIUM 9.4 10/03/2021   GFRAA 112 02/21/2021    Speciality Comments: PLQ eye exam: 07/14/2021 WNL  Dr. Lester Kins[address]. Follow up in 12 months.  Procedures:  No procedures performed Allergies: Patient has no known allergies.   Assessment / Plan:     Visit Diagnoses: Seronegative rheumatoid arthritis (HCC)-she had no synovitis on examination.  She denies any history of joint swelling.  She has been taking hydroxychloroquine 200 mg p.o. daily without any side effects.  High risk medication use - Plaquenil 200 mg tablet by mouth daily.  PLQ eye exam: 07/14/2021.   Labs obtained on October 03, 2021 were normal which were reviewed.  We will check labs today.- Plan: CBC with Differential/Platelet, COMPLETE METABOLIC PANEL WITH GFR  Primary osteoarthritis of both knees-she has been working out and denies any discomfort today.  Osteopenia of multiple sites - DEXA: 12/13/2020 T-score: -1.9, BMD: 0.637 right femoral neck. DEXA on 12/12/18: total left hip BMD 0.715 with T-score of -1.9.  Use of calcium rich diet, vitamin D and exercise was emphasized.  She does workout on a regular basis.  Chronic nonintractable headache, unspecified  headache type  Madarosis of eyelid, unspecified laterality  Malignant neoplasm of upper-outer quadrant of left breast in female, estrogen receptor positive ([place]) - dxd 02/21 mastectomy and RTX, no CTX  Encounter for health-related screening - Plan: Lipid panel  Orders: Orders Placed This Encounter  Procedures   CBC with Differential/Platelet   COMPLETE METABOLIC PANEL WITH GFR   Lipid panel   No orders of the defined types were placed in this encounter.    Follow-Up Instructions: Return in about 5 months (around 09/09/2022) for Rheumatoid arthritis.   Bo Merino, MD  Note - This record has been created using Editor, commissioning.  Chart creation errors have been sought, but may not always  have been located. Such creation errors do not reflect on  the standard of medical care.

## 2022-04-09 ENCOUNTER — Encounter: Payer: Self-pay | Admitting: Rheumatology

## 2022-04-09 ENCOUNTER — Ambulatory Visit: Payer: BC Managed Care – PPO | Admitting: Rheumatology

## 2022-04-09 VITALS — BP 111/76 | HR 85 | Resp 12 | Ht 63.5 in | Wt 124.4 lb

## 2022-04-09 DIAGNOSIS — R519 Headache, unspecified: Secondary | ICD-10-CM

## 2022-04-09 DIAGNOSIS — Z170 Estrogen receptor positive status [ER+]: Secondary | ICD-10-CM

## 2022-04-09 DIAGNOSIS — Z79899 Other long term (current) drug therapy: Secondary | ICD-10-CM | POA: Diagnosis not present

## 2022-04-09 DIAGNOSIS — M8589 Other specified disorders of bone density and structure, multiple sites: Secondary | ICD-10-CM | POA: Diagnosis not present

## 2022-04-09 DIAGNOSIS — Z139 Encounter for screening, unspecified: Secondary | ICD-10-CM | POA: Diagnosis not present

## 2022-04-09 DIAGNOSIS — C50412 Malignant neoplasm of upper-outer quadrant of left female breast: Secondary | ICD-10-CM

## 2022-04-09 DIAGNOSIS — H02729 Madarosis of unspecified eye, unspecified eyelid and periocular area: Secondary | ICD-10-CM

## 2022-04-09 DIAGNOSIS — M0600 Rheumatoid arthritis without rheumatoid factor, unspecified site: Secondary | ICD-10-CM | POA: Diagnosis not present

## 2022-04-09 DIAGNOSIS — M170 Bilateral primary osteoarthritis of knee: Secondary | ICD-10-CM

## 2022-04-09 DIAGNOSIS — G8929 Other chronic pain: Secondary | ICD-10-CM

## 2022-04-09 NOTE — Patient Instructions (Signed)
Vaccines You are taking a medication(s) that can suppress your immune system.  The following immunizations are recommended: Flu annually Covid-19  Td/Tdap (tetanus, diphtheria, pertussis) every 10 years Pneumonia (Prevnar 15 then Pneumovax 23 at least 1 year apart.  Alternatively, can take Prevnar 20 without needing additional dose) Shingrix: 2 doses from 4 weeks to 6 months apart  Please check with your PCP to make sure you are up to date.  

## 2022-04-10 LAB — COMPLETE METABOLIC PANEL WITH GFR
AG Ratio: 2.4 (calc) (ref 1.0–2.5)
ALT: 17 U/L (ref 6–29)
AST: 20 U/L (ref 10–35)
Albumin: 4.4 g/dL (ref 3.6–5.1)
Alkaline phosphatase (APISO): 47 U/L (ref 37–153)
BUN: 7 mg/dL (ref 7–25)
CO2: 29 mmol/L (ref 20–32)
Calcium: 9.8 mg/dL (ref 8.6–10.4)
Chloride: 101 mmol/L (ref 98–110)
Creat: 0.81 mg/dL (ref 0.50–1.03)
Globulin: 1.8 g/dL (calc) — ABNORMAL LOW (ref 1.9–3.7)
Glucose, Bld: 80 mg/dL (ref 65–99)
Potassium: 4.6 mmol/L (ref 3.5–5.3)
Sodium: 138 mmol/L (ref 135–146)
Total Bilirubin: 0.7 mg/dL (ref 0.2–1.2)
Total Protein: 6.2 g/dL (ref 6.1–8.1)
eGFR: 84 mL/min/{1.73_m2} (ref >=60)

## 2022-04-10 LAB — CBC WITH DIFFERENTIAL/PLATELET
Absolute Monocytes: 361 cells/uL (ref 200–950)
Basophils Absolute: 30 cells/uL (ref 0–200)
Basophils Relative: 0.7 %
Eosinophils Absolute: 69 cells/uL (ref 15–500)
Eosinophils Relative: 1.6 %
HCT: 40.4 % (ref 35.0–45.0)
Hemoglobin: 13.9 g/dL (ref 11.7–15.5)
Lymphs Abs: 1871 cells/uL (ref 850–3900)
MCH: 31.1 pg (ref 27.0–33.0)
MCHC: 34.4 g/dL (ref 32.0–36.0)
MCV: 90.4 fL (ref 80.0–100.0)
MPV: 9.6 fL (ref 7.5–12.5)
Monocytes Relative: 8.4 %
Neutro Abs: 1969 cells/uL (ref 1500–7800)
Neutrophils Relative %: 45.8 %
Platelets: 222 10*3/uL (ref 140–400)
RBC: 4.47 10*6/uL (ref 3.80–5.10)
RDW: 11.5 % (ref 11.0–15.0)
Total Lymphocyte: 43.5 %
WBC: 4.3 10*3/uL (ref 3.8–10.8)

## 2022-04-10 LAB — LIPID PANEL
Cholesterol: 159 mg/dL (ref <200)
HDL: 68 mg/dL (ref >=50)
LDL Cholesterol (Calc): 73 mg/dL (calc)
Non-HDL Cholesterol (Calc): 91 mg/dL (calc) (ref <130)
Total CHOL/HDL Ratio: 2.3 (calc) (ref <5.0)
Triglycerides: 95 mg/dL (ref <150)

## 2022-04-10 NOTE — Progress Notes (Signed)
CBC, CMP and lipid panel are within normal limits.

## 2022-04-30 DIAGNOSIS — Z01419 Encounter for gynecological examination (general) (routine) without abnormal findings: Secondary | ICD-10-CM | POA: Diagnosis not present

## 2022-04-30 DIAGNOSIS — Z124 Encounter for screening for malignant neoplasm of cervix: Secondary | ICD-10-CM | POA: Diagnosis not present

## 2022-04-30 DIAGNOSIS — Z6821 Body mass index (BMI) 21.0-21.9, adult: Secondary | ICD-10-CM | POA: Diagnosis not present

## 2022-04-30 DIAGNOSIS — R87810 Cervical high risk human papillomavirus (HPV) DNA test positive: Secondary | ICD-10-CM | POA: Diagnosis not present

## 2022-05-26 DIAGNOSIS — R87810 Cervical high risk human papillomavirus (HPV) DNA test positive: Secondary | ICD-10-CM | POA: Diagnosis not present

## 2022-06-07 ENCOUNTER — Other Ambulatory Visit: Payer: Self-pay | Admitting: Physician Assistant

## 2022-06-07 DIAGNOSIS — M0600 Rheumatoid arthritis without rheumatoid factor, unspecified site: Secondary | ICD-10-CM

## 2022-06-08 NOTE — Telephone Encounter (Signed)
Next Visit: Due November 2023. Message sent to the front to schedule patient.   Last Visit: 04/09/2022  Labs: 04/09/2022 CBC, CMP and lipid panel are within normal limits.  Eye exam:  07/14/2021 WNL   Current Dose per office note 04/09/2022: Plaquenil 200 mg tablet by mouth daily  DX: Seronegative rheumatoid arthritis   Last Fill: 03/06/2022  Okay to refill Plaquenil?

## 2022-06-08 NOTE — Telephone Encounter (Signed)
Please schedule patient a follow up visit. Patient due November 2023. Thanks!  

## 2022-07-21 DIAGNOSIS — Z79899 Other long term (current) drug therapy: Secondary | ICD-10-CM | POA: Diagnosis not present

## 2022-08-11 DIAGNOSIS — L820 Inflamed seborrheic keratosis: Secondary | ICD-10-CM | POA: Diagnosis not present

## 2022-08-11 DIAGNOSIS — L908 Other atrophic disorders of skin: Secondary | ICD-10-CM | POA: Diagnosis not present

## 2022-08-21 NOTE — Progress Notes (Deleted)
Office Visit Note  Patient: Stacey Craig             Date of Birth: 08-31-1961           MRN: [id number]             PCP: Hoyt Koch, MD Referring: Hoyt Koch, * Visit Date: 09/03/2022 Occupation: '@GUAROCC'$ @  Subjective:    History of Present Illness: Stacey Craig is a 61 y.o. female with a history of seronegative rheumatoid arthritis.  She is taking plaquenil 200 mg 1 tablet by mouth daily.   PLQ eye exam: 07/21/2022 WNL Dr. Elson Areas. Follow up in 12 months.  CBC and CMP updated on 04/09/22. Orders for CBC and CMP released today.   Activities of Daily Living:  Patient reports morning stiffness for *** {minute/hour:19697}.   Patient {ACTIONS;DENIES/REPORTS:21021675::"Denies"} nocturnal pain.  Difficulty dressing/grooming: {ACTIONS;DENIES/REPORTS:21021675::"Denies"} Difficulty climbing stairs: {ACTIONS;DENIES/REPORTS:21021675::"Denies"} Difficulty getting out of chair: {ACTIONS;DENIES/REPORTS:21021675::"Denies"} Difficulty using hands for taps, buttons, cutlery, and/or writing: {ACTIONS;DENIES/REPORTS:21021675::"Denies"}  No Rheumatology ROS completed.   PMFS History:  Patient Active Problem List   Diagnosis Date Noted   Word finding difficulty 06/06/2020   HX: breast cancer 12/20/2019   Routine general medical examination at a health care facility 09/12/2018   Chronic headaches 11/03/2017   Rheumatoid arthritis ([address]) 06/21/2015    Past Medical History:  Diagnosis Date   Arthritis    Breast cancer ([place])    Colon polyps    Genital warts    UTI (lower urinary tract infection)     Family History  Problem Relation Age of Onset   Cancer Father        brain, bladder, and skin   Heart disease Father    Stroke Father    Cancer Maternal Grandmother        breast   Cancer Paternal Grandmother        lung   Cancer Paternal Grandfather        bone   Past Surgical History:  Procedure Laterality Date   AUGMENTATION MAMMAPLASTY  Bilateral    BREAST SURGERY     FOREARM SURGERY Left    SKIN SURGERY  03/2020   chest, back    Social History   Social History Narrative   Right handed    Caffeine- 6 cups daily    Immunization History  Administered Date(s) Administered   PFIZER(Purple Top)SARS-COV-2 Vaccination 01/22/2020, 02/07/2020   Td 10/26/1990   Tdap 12/10/2021   Zoster Recombinat (Shingrix) 09/12/2018, 05/18/2019     Objective: Vital Signs: LMP 11/13/2011    Physical Exam Vitals and nursing note reviewed.  Constitutional:      Appearance: She is well-developed.  HENT:     Head: Normocephalic and atraumatic.  Eyes:     Conjunctiva/sclera: Conjunctivae normal.  Cardiovascular:     Rate and Rhythm: Normal rate and regular rhythm.     Heart sounds: Normal heart sounds.  Pulmonary:     Effort: Pulmonary effort is normal.     Breath sounds: Normal breath sounds.  Abdominal:     General: Bowel sounds are normal.     Palpations: Abdomen is soft.  Musculoskeletal:     Cervical back: Normal range of motion.  Skin:    General: Skin is warm and dry.     Capillary Refill: Capillary refill takes less than 2 seconds.  Neurological:     Mental Status: She is alert and oriented to person, place, and time.  Psychiatric:  Behavior: Behavior normal.      Musculoskeletal Exam: ***  CDAI Exam: CDAI Score: -- Patient Global: --; Provider Global: -- Swollen: --; Tender: -- Joint Exam 09/03/2022   No joint exam has been documented for this visit   There is currently no information documented on the homunculus. Go to the Rheumatology activity and complete the homunculus joint exam.  Investigation: No additional findings.  Imaging: No results found.  Recent Labs: Lab Results  Component Value Date   WBC 4.3 04/09/2022   HGB 13.9 04/09/2022   PLT 222 04/09/2022   NA 138 04/09/2022   K 4.6 04/09/2022   CL 101 04/09/2022   CO2 29 04/09/2022   GLUCOSE 80 04/09/2022   BUN 7 04/09/2022    CREATININE 0.81 04/09/2022   BILITOT 0.7 04/09/2022   ALKPHOS 39 11/27/2019   AST 20 04/09/2022   ALT 17 04/09/2022   PROT 6.2 04/09/2022   ALBUMIN 4.7 11/27/2019   CALCIUM 9.8 04/09/2022   GFRAA 112 02/21/2021    Speciality Comments: PLQ eye exam: 07/21/2022 WNL  Dr. Lester [address]. Follow up in 12 months.  Procedures:  No procedures performed Allergies: Patient has no known allergies.   Assessment / Plan:     Visit Diagnoses: No diagnosis found.  Orders: No orders of the defined types were placed in this encounter.  No orders of the defined types were placed in this encounter.   Face-to-face time spent with patient was *** minutes. Greater than 50% of time was spent in counseling and coordination of care.  Follow-Up Instructions: No follow-ups on file.   Earnestine Mealing, CMA  Note - This record has been created using Editor, commissioning.  Chart creation errors have been sought, but may not always  have been located. Such creation errors do not reflect on  the standard of medical care.

## 2022-09-03 ENCOUNTER — Ambulatory Visit: Payer: BC Managed Care – PPO | Admitting: Physician Assistant

## 2022-09-03 DIAGNOSIS — R519 Headache, unspecified: Secondary | ICD-10-CM

## 2022-09-03 DIAGNOSIS — H02729 Madarosis of unspecified eye, unspecified eyelid and periocular area: Secondary | ICD-10-CM

## 2022-09-03 DIAGNOSIS — M8589 Other specified disorders of bone density and structure, multiple sites: Secondary | ICD-10-CM

## 2022-09-03 DIAGNOSIS — Z170 Estrogen receptor positive status [ER+]: Secondary | ICD-10-CM

## 2022-09-03 DIAGNOSIS — M170 Bilateral primary osteoarthritis of knee: Secondary | ICD-10-CM

## 2022-09-03 DIAGNOSIS — M0600 Rheumatoid arthritis without rheumatoid factor, unspecified site: Secondary | ICD-10-CM

## 2022-09-03 DIAGNOSIS — Z79899 Other long term (current) drug therapy: Secondary | ICD-10-CM

## 2022-09-08 ENCOUNTER — Other Ambulatory Visit: Payer: Self-pay | Admitting: Physician Assistant

## 2022-09-08 DIAGNOSIS — M0600 Rheumatoid arthritis without rheumatoid factor, unspecified site: Secondary | ICD-10-CM

## 2022-09-08 DIAGNOSIS — Z79899 Other long term (current) drug therapy: Secondary | ICD-10-CM

## 2022-09-08 NOTE — Addendum Note (Signed)
Addended by: Carole Binning on: 09/08/2022 02:07 PM   Modules accepted: Orders

## 2022-09-08 NOTE — Telephone Encounter (Signed)
Next Visit: 10/16/2022    Last Visit: 04/09/2022   Labs: 04/09/2022 CBC, CMP and lipid panel are within normal limits.   Eye exam:  07/21/2022 WNL    Current Dose per office note 04/09/2022: Plaquenil 200 mg tablet by mouth daily   DX: Seronegative rheumatoid arthritis    Last Fill: 06/08/2022   Okay to refill Plaquenil?

## 2022-09-08 NOTE — Telephone Encounter (Signed)
Patient advised she is due to update her lab work. Patient will come by the office this week to update.

## 2022-09-08 NOTE — Telephone Encounter (Signed)
Patient due to update lab work

## 2022-09-09 ENCOUNTER — Other Ambulatory Visit: Payer: Self-pay | Admitting: *Deleted

## 2022-09-09 DIAGNOSIS — Z79899 Other long term (current) drug therapy: Secondary | ICD-10-CM | POA: Diagnosis not present

## 2022-09-09 LAB — COMPLETE METABOLIC PANEL WITH GFR
AG Ratio: 2.5 (calc) (ref 1.0–2.5)
ALT: 19 U/L (ref 6–29)
AST: 20 U/L (ref 10–35)
Albumin: 4.2 g/dL (ref 3.6–5.1)
Alkaline phosphatase (APISO): 47 U/L (ref 37–153)
BUN: 8 mg/dL (ref 7–25)
CO2: 30 mmol/L (ref 20–32)
Calcium: 9.2 mg/dL (ref 8.6–10.4)
Chloride: 103 mmol/L (ref 98–110)
Creat: 0.75 mg/dL (ref 0.50–1.03)
Globulin: 1.7 g/dL (calc) — ABNORMAL LOW (ref 1.9–3.7)
Glucose, Bld: 93 mg/dL (ref 65–99)
Potassium: 4.1 mmol/L (ref 3.5–5.3)
Sodium: 139 mmol/L (ref 135–146)
Total Bilirubin: 0.5 mg/dL (ref 0.2–1.2)
Total Protein: 5.9 g/dL — ABNORMAL LOW (ref 6.1–8.1)
eGFR: 92 mL/min/{1.73_m2} (ref >=60)

## 2022-09-09 LAB — CBC WITH DIFFERENTIAL/PLATELET
Absolute Monocytes: 382 cells/uL (ref 200–950)
Basophils Absolute: 29 cells/uL (ref 0–200)
Basophils Relative: 0.6 %
Eosinophils Absolute: 103 cells/uL (ref 15–500)
Eosinophils Relative: 2.1 %
HCT: 36.8 % (ref 35.0–45.0)
Hemoglobin: 13 g/dL (ref 11.7–15.5)
Lymphs Abs: 1916 cells/uL (ref 850–3900)
MCH: 31.3 pg (ref 27.0–33.0)
MCHC: 35.3 g/dL (ref 32.0–36.0)
MCV: 88.5 fL (ref 80.0–100.0)
MPV: 10.1 fL (ref 7.5–12.5)
Monocytes Relative: 7.8 %
Neutro Abs: 2470 cells/uL (ref 1500–7800)
Neutrophils Relative %: 50.4 %
Platelets: 218 10*3/uL (ref 140–400)
RBC: 4.16 10*6/uL (ref 3.80–5.10)
RDW: 11.5 % (ref 11.0–15.0)
Total Lymphocyte: 39.1 %
WBC: 4.9 10*3/uL (ref 3.8–10.8)

## 2022-09-10 NOTE — Progress Notes (Signed)
CBC and CMP are normal.

## 2022-10-02 NOTE — Progress Notes (Signed)
Office Visit Note  Patient: Stacey Craig             Date of Birth: 1961/05/10           MRN: [id number]             PCP: Hoyt Koch, MD Referring: Hoyt Koch, * Visit Date: 10/16/2022 Occupation: '@GUAROCC'$ @  Subjective:  Right shoulder pain  History of Present Illness: Stacey Craig is a 61 y.o. female with history of seronegative rheumatoid arthritis.  She is currently taking Plaquenil 200 mg tablet by mouth daily. She is tolerating plaquenil without any side effects and has not missed any doses recently.  Patient denies any recent rheumatoid arthritis flares.  She is not currently experiencing any joint swelling.  She reports that she has been experiencing right shoulder joint pain since summer 2023 at which time she tweaked her knee while at the gym.  She has been under the care of an orthopedist at [facility].  She has been performing home stretching and strengthening exercises.  She has not had a cortisone injection yet.  She states that she has had intermittent discomfort in her right hip and right knee joint and is unsure if it is related.  She has not been experiencing any nocturnal pain.  She has remained active exercising on a daily basis. She denies any new medical conditions.  She has not had any recent or recurrent infections.    Activities of Daily Living:  Patient reports morning stiffness for 0 minutes.   Patient Denies nocturnal pain.  Difficulty dressing/grooming: Denies Difficulty climbing stairs: Denies Difficulty getting out of chair: Denies Difficulty using hands for taps, buttons, cutlery, and/or writing: Denies  Review of Systems  Constitutional:  Negative for fatigue.  HENT:  Negative for mouth sores and mouth dryness.   Eyes:  Positive for dryness.  Respiratory:  Negative for shortness of breath.   Cardiovascular:  Negative for chest pain and palpitations.  Gastrointestinal:  Negative for blood in stool,  constipation and diarrhea.  Endocrine: Negative for increased urination.  Genitourinary:  Positive for nocturia. Negative for involuntary urination.  Musculoskeletal:  Positive for joint pain and joint pain. Negative for gait problem, joint swelling, myalgias, muscle weakness, morning stiffness, muscle tenderness and myalgias.  Skin:  Negative for color change, rash, hair loss and sensitivity to sunlight.  Allergic/Immunologic: Negative for susceptible to infections.  Neurological:  Negative for dizziness and headaches.  Hematological:  Negative for swollen glands.  Psychiatric/Behavioral:  Negative for depressed mood and sleep disturbance. The patient is not nervous/anxious.     PMFS History:  Patient Active Problem List   Diagnosis Date Noted   Word finding difficulty 06/06/2020   HX: breast cancer 12/20/2019   Routine general medical examination at a health care facility 09/12/2018   Chronic headaches 11/03/2017   Rheumatoid arthritis ([place]) 06/21/2015    Past Medical History:  Diagnosis Date   Arthritis    Breast cancer ([place])    Colon polyps    Genital warts    UTI (lower urinary tract infection)     Family History  Problem Relation Age of Onset   Cancer Father        brain, bladder, and skin   Heart disease Father    Stroke Father    Cancer Maternal Grandmother        breast   Cancer Paternal Grandmother        lung   Cancer Paternal Grandfather  bone   Past Surgical History:  Procedure Laterality Date   AUGMENTATION MAMMAPLASTY Bilateral    BREAST SURGERY     FOREARM SURGERY Left    SKIN SURGERY  03/2020   chest, back    Social History   Social History Narrative   Right handed    Caffeine- 6 cups daily    Immunization History  Administered Date(s) Administered   PFIZER(Purple Top)SARS-COV-2 Vaccination 01/22/2020, 02/07/2020   Td 10/26/1990   Tdap 12/10/2021   Zoster Recombinat (Shingrix) 09/12/2018, 05/18/2019     Objective: Vital Signs: BP  120/86 (BP Location: Left Arm, Patient Position: Sitting, Cuff Size: Normal)   Pulse 79   Resp 14   Ht '5\' 3"'$  (1.6 m)   Wt 124 lb (56.2 kg)   LMP 11/13/2011   BMI 21.97 kg/m    Physical Exam Vitals and nursing note reviewed.  Constitutional:      Appearance: She is well-developed.  HENT:     Head: Normocephalic and atraumatic.  Eyes:     Conjunctiva/sclera: Conjunctivae normal.  Cardiovascular:     Rate and Rhythm: Normal rate and regular rhythm.     Heart sounds: Normal heart sounds.  Pulmonary:     Effort: Pulmonary effort is normal.     Breath sounds: Normal breath sounds.  Abdominal:     General: Bowel sounds are normal.     Palpations: Abdomen is soft.  Musculoskeletal:     Cervical back: Normal range of motion.  Skin:    General: Skin is warm and dry.     Capillary Refill: Capillary refill takes less than 2 seconds.  Neurological:     Mental Status: She is alert and oriented to person, place, and time.  Psychiatric:        Behavior: Behavior normal.      Musculoskeletal Exam: C-spine, thoracic spine, lumbar spine have good range of motion.  No midline spinal tenderness or SI joint tenderness.  She has tenderness over the piriformis muscle on the right side. Shoulder joints, elbow joints, wrist joints, MCPs, PIPs, DIPs have good range of motion with no synovitis.  DIP thickening consistent with early osteoarthritic changes.  Complete fist formation bilaterally.  Hip joints have good range of motion with no groin pain.  No tenderness over trochanteric bursa bilaterally.  Knee joints have good range of motion with no warmth or effusion.  Ankle joints have good range of motion with no tenderness or synovitis.  CDAI Exam: CDAI Score: -- Patient Global: 1 mm; Provider Global: 1 mm Swollen: --; Tender: -- Joint Exam 10/16/2022   No joint exam has been documented for this visit   There is currently no information documented on the homunculus. Go to the Rheumatology  activity and complete the homunculus joint exam.  Investigation: No additional findings.  Imaging: No results found.  Recent Labs: Lab Results  Component Value Date   WBC 4.9 09/09/2022   HGB 13.0 09/09/2022   PLT 218 09/09/2022   NA 139 09/09/2022   K 4.1 09/09/2022   CL 103 09/09/2022   CO2 30 09/09/2022   GLUCOSE 93 09/09/2022   BUN 8 09/09/2022   CREATININE 0.75 09/09/2022   BILITOT 0.5 09/09/2022   ALKPHOS 39 11/27/2019   AST 20 09/09/2022   ALT 19 09/09/2022   PROT 5.9 (L) 09/09/2022   ALBUMIN 4.7 11/27/2019   CALCIUM 9.2 09/09/2022   GFRAA 112 02/21/2021    Speciality Comments: PLQ eye exam: 07/21/2022 WNL  Dr. Lester [address]  [address]. Follow up in 12 months.  Procedures:  No procedures performed Allergies: Patient has no known allergies.   Assessment / Plan:     Visit Diagnoses: Seronegative rheumatoid arthritis ([place]): She has no synovitis on examination today.  She has not had any signs or symptoms of a rheumatoid arthritis flare.  She has clinically been doing well taking Plaquenil 200 mg 1 tablet by mouth daily.  She is tolerating Plaquenil without any side effects.  She has not been experiencing any morning stiffness or nocturnal pain.  She remains active exercising on a daily basis.  She has had persistent discomfort in her right shoulder which has gradually been improving since the summer 2023.  She has been under the care of [facility] orthopedics.  She has not had a cortisone injection and does not plan to if her symptoms continue to improve.  She had no inflammation on examination today.  She will remain on Plaquenil as prescribed.  She was advised to notify us if she develops signs or symptoms of a flare.  She will follow-up in the office in 5 months or sooner if needed.  High risk medication use - Plaquenil 200 mg 1 tablet by mouth daily.   CBC and CMP updated on 09/09/22.  PLQ eye exam: 07/21/2022 WNL Dr. Elson Areas. Follow up in 12 months.   Primary  osteoarthritis of both knees: She has good range of motion of both knee joints on examination today.  She has been experiencing intermittent discomfort in the right knee joint.  No warmth or effusion noted.  No mechanical symptoms.  She continues to remain active exercising on a daily basis.  Osteopenia of multiple sites - DEXA: 12/13/2020 T-score: -1.9, BMD: 0.637 right femoral neck. DEXA on 12/12/18: total left hip BMD 0.715 with T-score of -1.9.  She is taking a vitamin D and calcium supplement on a daily basis. No recent falls or fractures.  Other medical conditions are listed as follows:  Chronic nonintractable headache, unspecified headache type  Madarosis of eyelid, unspecified laterality  Malignant neoplasm of upper-outer quadrant of left breast in female, estrogen receptor positive ([place]) - dxd 02/21 mastectomy and RTX, no CTX  Orders: No orders of the defined types were placed in this encounter.  No orders of the defined types were placed in this encounter.    Follow-Up Instructions: Return in about 5 months (around 03/17/2023) for Rheumatoid arthritis.   Ofilia Neas, PA-C  Note - This record has been created using Dragon software.  Chart creation errors have been sought, but may not always  have been located. Such creation errors do not reflect on  the standard of medical care.

## 2022-10-16 ENCOUNTER — Ambulatory Visit: Payer: BC Managed Care – PPO | Attending: Physician Assistant | Admitting: Physician Assistant

## 2022-10-16 ENCOUNTER — Encounter: Payer: Self-pay | Admitting: Physician Assistant

## 2022-10-16 VITALS — BP 120/86 | HR 79 | Resp 14 | Ht 63.0 in | Wt 124.0 lb

## 2022-10-16 DIAGNOSIS — H02729 Madarosis of unspecified eye, unspecified eyelid and periocular area: Secondary | ICD-10-CM

## 2022-10-16 DIAGNOSIS — G8929 Other chronic pain: Secondary | ICD-10-CM

## 2022-10-16 DIAGNOSIS — M170 Bilateral primary osteoarthritis of knee: Secondary | ICD-10-CM

## 2022-10-16 DIAGNOSIS — R519 Headache, unspecified: Secondary | ICD-10-CM

## 2022-10-16 DIAGNOSIS — M8589 Other specified disorders of bone density and structure, multiple sites: Secondary | ICD-10-CM | POA: Diagnosis not present

## 2022-10-16 DIAGNOSIS — Z170 Estrogen receptor positive status [ER+]: Secondary | ICD-10-CM

## 2022-10-16 DIAGNOSIS — Z79899 Other long term (current) drug therapy: Secondary | ICD-10-CM

## 2022-10-16 DIAGNOSIS — C50412 Malignant neoplasm of upper-outer quadrant of left female breast: Secondary | ICD-10-CM

## 2022-10-16 DIAGNOSIS — M0600 Rheumatoid arthritis without rheumatoid factor, unspecified site: Secondary | ICD-10-CM | POA: Diagnosis not present

## 2022-11-24 ENCOUNTER — Other Ambulatory Visit: Payer: Self-pay | Admitting: Obstetrics and Gynecology

## 2022-11-24 DIAGNOSIS — Z1231 Encounter for screening mammogram for malignant neoplasm of breast: Secondary | ICD-10-CM

## 2022-12-05 ENCOUNTER — Other Ambulatory Visit: Payer: Self-pay | Admitting: Physician Assistant

## 2022-12-05 DIAGNOSIS — M0600 Rheumatoid arthritis without rheumatoid factor, unspecified site: Secondary | ICD-10-CM

## 2022-12-07 NOTE — Telephone Encounter (Signed)
Next Visit: Message sent to front desk to schedule f/u appt  Return in about 5 months (around 03/17/2023) for Rheumatoid arthritis.  Last Visit: 10/16/2022  Labs: 09/09/2022  CBC and CMP are normal.   Eye exam: 07/21/2022   Current Dose per office note 10/16/2022: Plaquenil 200 mg 1 tablet by mouth daily.     DX: Seronegative rheumatoid arthritis    Last Fill: 09/08/2022  Okay to refill Plaquenil?

## 2022-12-07 NOTE — Telephone Encounter (Signed)
Please call patient to schedule f/u appt. Thankyou.    Return in about 5 months (around 03/17/2023) for Rheumatoid arthritis.

## 2023-01-12 ENCOUNTER — Ambulatory Visit
Admission: RE | Admit: 2023-01-12 | Discharge: 2023-01-12 | Disposition: A | Discharge status: Home / Self Care | Payer: BC Managed Care – PPO | Source: Ambulatory Visit | Attending: Obstetrics and Gynecology | Admitting: Obstetrics and Gynecology

## 2023-01-12 DIAGNOSIS — Z1231 Encounter for screening mammogram for malignant neoplasm of breast: Secondary | ICD-10-CM | POA: Diagnosis not present

## 2023-03-03 ENCOUNTER — Other Ambulatory Visit: Payer: Self-pay | Admitting: Physician Assistant

## 2023-03-03 ENCOUNTER — Encounter: Payer: Self-pay | Admitting: *Deleted

## 2023-03-03 DIAGNOSIS — M0600 Rheumatoid arthritis without rheumatoid factor, unspecified site: Secondary | ICD-10-CM

## 2023-03-03 NOTE — Telephone Encounter (Signed)
Last Fill: 12/07/2022  Eye exam: 07/21/2022 WNL    Labs: 09/09/2022 CBC and CMP are normal.   Next Visit: Due May 2024. Message sent to the front to schedule.   Last Visit: 10/16/2022  DX: Seronegative rheumatoid arthritis   Current Dose per office note 10/16/2022: Plaquenil 200 mg 1 tablet by mouth daily.    Message sent to patient via my chart to advise patient she is due to update labs  Okay to refill Plaquenil?

## 2023-03-03 NOTE — Telephone Encounter (Signed)
Please schedule patient a follow up visit. Patient due May 2024. Thanks!  

## 2023-03-04 NOTE — Progress Notes (Unsigned)
Office Visit Note  Patient: Stacey Craig             Date of Birth: [birth year]           MRN: [id number]             PCP: Myrlene Broker, MD Referring: Myrlene Broker, * Visit Date: 03/18/2023 Occupation: @GUAROCC @  Subjective:  Medication monitoring  History of Present Illness: Ruchy Umbach is a 62 y.o. female with history of seronegative rheumatoid arthritis, osteoarthritis, and osteopenia. She is taking plaquenil 200 mg 1 tablet by mouth daily.  She is tolerating Plaquenil without any side effects and has not missed any doses recently.  She denies any recent rheumatoid arthritis flares.  She has occasional arthralgias but overall her symptoms have been manageable.  She denies any morning stiffness or difficulty with ADLs.  She has been walking on a regular basis for exercise and has been trying to do weight training at least once a week. She is due to update her bone density.  She denies any new medical conditions.    Activities of Daily Living:  Patient reports morning stiffness for 0 minutes.   Patient Reports nocturnal pain.  Difficulty dressing/grooming: Denies Difficulty climbing stairs: Denies Difficulty getting out of chair: Denies Difficulty using hands for taps, buttons, cutlery, and/or writing: Denies  Review of Systems  Constitutional:  Positive for fatigue.  HENT:  Negative for mouth sores and mouth dryness.   Eyes:  Negative for dryness.  Respiratory:  Negative for shortness of breath.   Cardiovascular:  Negative for chest pain and palpitations.  Gastrointestinal:  Negative for blood in stool, constipation and diarrhea.  Endocrine: Negative for increased urination.  Genitourinary:  Negative for involuntary urination.  Musculoskeletal:  Positive for joint pain and joint pain. Negative for gait problem, joint swelling, myalgias, muscle weakness, morning stiffness, muscle tenderness and myalgias.  Skin:  Negative for color change,  rash, hair loss and sensitivity to sunlight.  Allergic/Immunologic: Negative for susceptible to infections.  Neurological:  Negative for dizziness and headaches.  Hematological:  Negative for swollen glands.  Psychiatric/Behavioral:  Positive for sleep disturbance. Negative for depressed mood. The patient is not nervous/anxious.     PMFS History:  Patient Active Problem List   Diagnosis Date Noted   Word finding difficulty 06/06/2020   HX: breast cancer 12/20/2019   Routine general medical examination at a health care facility 09/12/2018   Chronic headaches 11/03/2017   Rheumatoid arthritis (HCC) 06/21/2015    Past Medical History:  Diagnosis Date   Arthritis    Breast cancer (HCC)    Colon polyps    Genital warts    UTI (lower urinary tract infection)     Family History  Problem Relation Age of Onset   Cancer Father        brain, bladder, and skin   Heart disease Father    Stroke Father    Cancer Maternal Grandmother        breast   Cancer Paternal Grandmother        lung   Cancer Paternal Grandfather        bone   Past Surgical History:  Procedure Laterality Date   AUGMENTATION MAMMAPLASTY Bilateral    BREAST SURGERY     FOREARM SURGERY Left    SKIN SURGERY  03/2020   chest, back    Social History   Social History Narrative   Right handed    Caffeine- 6 cups  daily    Immunization History  Administered Date(s) Administered   PFIZER(Purple Top)SARS-COV-2 Vaccination 01/22/2020, 02/07/2020   Td 10/26/1990   Tdap 12/10/2021   Zoster Recombinat (Shingrix) 09/12/2018, 05/18/2019     Objective: Vital Signs: BP 113/75 (BP Location: Left Arm, Patient Position: Sitting, Cuff Size: Normal)   Pulse 63   Resp 15   Ht 5\' 3"  (1.6 m)   Wt 126 lb (57.2 kg)   LMP 11/13/2011   BMI 22.32 kg/m    Physical Exam Vitals and nursing note reviewed.  Constitutional:      Appearance: She is well-developed.  HENT:     Head: Normocephalic and atraumatic.  Eyes:      Conjunctiva/sclera: Conjunctivae normal.  Cardiovascular:     Rate and Rhythm: Normal rate and regular rhythm.     Heart sounds: Normal heart sounds.  Pulmonary:     Effort: Pulmonary effort is normal.     Breath sounds: Normal breath sounds.  Abdominal:     General: Bowel sounds are normal.     Palpations: Abdomen is soft.  Musculoskeletal:     Cervical back: Normal range of motion.  Lymphadenopathy:     Cervical: No cervical adenopathy.  Skin:    General: Skin is warm and dry.     Capillary Refill: Capillary refill takes less than 2 seconds.  Neurological:     Mental Status: She is alert and oriented to person, place, and time.  Psychiatric:        Behavior: Behavior normal.      Musculoskeletal Exam: C-spine, thoracic spine, lumbar spine have good range of motion.  Shoulder joints, elbow joints, wrist joints, MCPs, PIPs, DIPs have good range of motion with no synovitis.  Some PIP and DIP thickening consistent with osteoarthritis of both hands.  Complete fist formation bilaterally.  Hip joints have good range of motion with no groin pain.  Knee joints have good range of motion with no warmth or effusion.  Ankle joints have good range of motion with no joint tenderness or synovitis.  No tenderness or synovitis over MTP joints.  CDAI Exam: CDAI Score: -- Patient Global: 0 mm; Provider Global: 0 mm Swollen: --; Tender: -- Joint Exam 03/18/2023   No joint exam has been documented for this visit   There is currently no information documented on the homunculus. Go to the Rheumatology activity and complete the homunculus joint exam.  Investigation: No additional findings.  Imaging: No results found.  Recent Labs: Lab Results  Component Value Date   WBC 4.4 03/15/2023   HGB 13.6 03/15/2023   PLT 225 03/15/2023   NA 139 03/15/2023   K 4.3 03/15/2023   CL 104 03/15/2023   CO2 26 03/15/2023   GLUCOSE 81 03/15/2023   BUN 9 03/15/2023   CREATININE 0.75 03/15/2023   BILITOT  0.6 03/15/2023   ALKPHOS 39 11/27/2019   AST 17 03/15/2023   ALT 14 03/15/2023   PROT 6.0 (L) 03/15/2023   ALBUMIN 4.7 11/27/2019   CALCIUM 9.7 03/15/2023   GFRAA 112 02/21/2021    Speciality Comments: PLQ eye exam: 07/21/2022 WNL  Dr. Gar [address]. Follow up in 12 months.  Procedures:  No procedures performed Allergies: Patient has no known allergies.     Assessment / Plan:     Visit Diagnoses: Seronegative rheumatoid arthritis (HCC) -She has no joint tenderness or synovitis on examination today.  She has not had any signs or symptoms of a rheumatoid arthritis flare.  She has  clinically been doing well taking Plaquenil 200 mg 1 tablet by mouth daily.  She is tolerating Plaquenil without any side effects.  She has not missed any doses of Plaquenil recently.  She has not been experiencing any morning stiffness or difficulty with ADLs.  She has been trying to remain active walking for exercise as well as doing weight training.  She was advised to notify us if she develops signs or symptoms of a rheumatoid arthritis flare.  She will remain on Plaquenil as prescribed.  A refill of Plaquenil be sent to the pharmacy today for 90-day supply.  She will follow-up in the office in 5 months or sooner if needed.  Plan: hydroxychloroquine (PLAQUENIL) 200 MG tablet  High risk medication use - Plaquenil 200 mg 1 tablet by mouth daily. CBC and CMP updated on 03/15/23. She will continue to require updated lab work every 5 months.   PLQ eye exam: 07/21/2022 WNL Dr. Sheral Flow. Follow up in 12 months.   Primary osteoarthritis of both knees: Good range of motion of both knee joints on examination today.  No warmth or effusion noted.  She has been walking for exercise.  She has occasional discomfort in her knees at night but no joint swelling.  Osteopenia of multiple sites - DEXA: 12/13/2020 T-score: -1.9, BMD: 0.637 right femoral neck.  Previous DEXA on 12/12/18: total left hip BMD 0.715 with T-score of -1.9.   Plan to update DEXA.  Future order placed today.  Discussed the importance of calcium, vitamin D, and resistive exercises.- Plan: DG BONE DENSITY (DXA)  Other medical conditions are listed as follows:  Chronic nonintractable headache, unspecified headache type  Malignant neoplasm of upper-outer quadrant of left breast in female, estrogen receptor positive (HCC) - dxd 02/21 mastectomy and RTX, no CTX  Orders: Orders Placed This Encounter  Procedures   DG BONE DENSITY (DXA)   Meds ordered this encounter  Medications   hydroxychloroquine (PLAQUENIL) 200 MG tablet    Sig: Take 1 tablet (200 mg total) by mouth daily.    Dispense:  90 tablet    Refill:  0    Follow-Up Instructions: Return in about 5 months (around 08/18/2023) for Rheumatoid arthritis, Osteoarthritis.   Gearldine Bienenstock, PA-C  Note - This record has been created using Dragon software.  Chart creation errors have been sought, but may not always  have been located. Such creation errors do not reflect on  the standard of medical care.

## 2023-03-15 ENCOUNTER — Other Ambulatory Visit: Payer: Self-pay | Admitting: *Deleted

## 2023-03-15 DIAGNOSIS — Z79899 Other long term (current) drug therapy: Secondary | ICD-10-CM | POA: Diagnosis not present

## 2023-03-15 LAB — CBC WITH DIFFERENTIAL/PLATELET
Basophils Relative: 0.7 %
Eosinophils Absolute: 110 cells/uL (ref 15–500)
Hemoglobin: 13.6 g/dL (ref 11.7–15.5)
MPV: 9.7 fL (ref 7.5–12.5)
Monocytes Relative: 7.5 %
Total Lymphocyte: 46.6 %
WBC: 4.4 10*3/uL (ref 3.8–10.8)

## 2023-03-16 LAB — COMPLETE METABOLIC PANEL WITH GFR
AG Ratio: 3 (calc) — ABNORMAL HIGH (ref 1.0–2.5)
ALT: 14 U/L (ref 6–29)
AST: 17 U/L (ref 10–35)
Albumin: 4.5 g/dL (ref 3.6–5.1)
Alkaline phosphatase (APISO): 45 U/L (ref 37–153)
BUN: 9 mg/dL (ref 7–25)
CO2: 26 mmol/L (ref 20–32)
Calcium: 9.7 mg/dL (ref 8.6–10.4)
Chloride: 104 mmol/L (ref 98–110)
Creat: 0.75 mg/dL (ref 0.50–1.03)
Globulin: 1.5 g/dL (calc) — ABNORMAL LOW (ref 1.9–3.7)
Glucose, Bld: 81 mg/dL (ref 65–99)
Potassium: 4.3 mmol/L (ref 3.5–5.3)
Sodium: 139 mmol/L (ref 135–146)
Total Bilirubin: 0.6 mg/dL (ref 0.2–1.2)
Total Protein: 6 g/dL — ABNORMAL LOW (ref 6.1–8.1)
eGFR: 92 mL/min/{1.73_m2} (ref >=60)

## 2023-03-16 LAB — CBC WITH DIFFERENTIAL/PLATELET
Absolute Monocytes: 330 cells/uL (ref 200–950)
Basophils Absolute: 31 cells/uL (ref 0–200)
Eosinophils Relative: 2.5 %
HCT: 38.8 % (ref 35.0–45.0)
Lymphs Abs: 2050 cells/uL (ref 850–3900)
MCH: 30.8 pg (ref 27.0–33.0)
MCHC: 35.1 g/dL (ref 32.0–36.0)
MCV: 87.8 fL (ref 80.0–100.0)
Neutro Abs: 1879 cells/uL (ref 1500–7800)
Neutrophils Relative %: 42.7 %
Platelets: 225 10*3/uL (ref 140–400)
RBC: 4.42 10*6/uL (ref 3.80–5.10)
RDW: 11.7 % (ref 11.0–15.0)

## 2023-03-16 NOTE — Progress Notes (Signed)
CBC and CMP are stable.

## 2023-03-18 ENCOUNTER — Encounter: Payer: Self-pay | Admitting: Physician Assistant

## 2023-03-18 ENCOUNTER — Ambulatory Visit: Payer: BC Managed Care – PPO | Attending: Physician Assistant | Admitting: Physician Assistant

## 2023-03-18 VITALS — BP 113/75 | HR 63 | Resp 15 | Ht 63.0 in | Wt 126.0 lb

## 2023-03-18 DIAGNOSIS — M0600 Rheumatoid arthritis without rheumatoid factor, unspecified site: Secondary | ICD-10-CM

## 2023-03-18 DIAGNOSIS — M170 Bilateral primary osteoarthritis of knee: Secondary | ICD-10-CM

## 2023-03-18 DIAGNOSIS — Z170 Estrogen receptor positive status [ER+]: Secondary | ICD-10-CM

## 2023-03-18 DIAGNOSIS — G8929 Other chronic pain: Secondary | ICD-10-CM

## 2023-03-18 DIAGNOSIS — C50412 Malignant neoplasm of upper-outer quadrant of left female breast: Secondary | ICD-10-CM

## 2023-03-18 DIAGNOSIS — Z79899 Other long term (current) drug therapy: Secondary | ICD-10-CM

## 2023-03-18 DIAGNOSIS — R519 Headache, unspecified: Secondary | ICD-10-CM

## 2023-03-18 DIAGNOSIS — M8589 Other specified disorders of bone density and structure, multiple sites: Secondary | ICD-10-CM

## 2023-03-18 MED ORDER — HYDROXYCHLOROQUINE SULFATE 200 MG PO TABS: 200.0000 mg | ORAL_TABLET | Freq: Every day | ORAL | 0 refills | Status: DC

## 2023-04-15 DIAGNOSIS — M150 Primary generalized (osteo)arthritis: Secondary | ICD-10-CM | POA: Diagnosis not present

## 2023-04-15 DIAGNOSIS — M859 Disorder of bone density and structure, unspecified: Secondary | ICD-10-CM | POA: Diagnosis not present

## 2023-04-15 LAB — HM DEXA SCAN

## 2023-04-23 ENCOUNTER — Telehealth: Payer: Self-pay | Admitting: *Deleted

## 2023-04-23 NOTE — Telephone Encounter (Signed)
Received DEXA results from [facility].  Date of Scan: 04/15/2023  Lowest T-score:-2.3  BMD:0.669  Lowest site measured: Left Total Hip  DX: Osteopenia  Significant changes in BMD and site measured (5% and above):-9 % Right Total Hip, -7% Left Total Hip, -6 % AP Spine, Total  Current Regimen:Calcium, Vitamin D   Recommendation:Can discuss treatment options at Follow up visit.   Reviewed by:Dr. Pollyann Savoy   Next Appointment:  08/19/2023

## 2023-05-06 DIAGNOSIS — R87810 Cervical high risk human papillomavirus (HPV) DNA test positive: Secondary | ICD-10-CM | POA: Diagnosis not present

## 2023-05-06 DIAGNOSIS — Z01419 Encounter for gynecological examination (general) (routine) without abnormal findings: Secondary | ICD-10-CM | POA: Diagnosis not present

## 2023-05-20 ENCOUNTER — Encounter: Payer: Self-pay | Admitting: Physician Assistant

## 2023-06-22 DIAGNOSIS — D0439 Carcinoma in situ of skin of other parts of face: Secondary | ICD-10-CM | POA: Diagnosis not present

## 2023-06-28 ENCOUNTER — Other Ambulatory Visit: Payer: Self-pay | Admitting: Physician Assistant

## 2023-06-28 DIAGNOSIS — M0600 Rheumatoid arthritis without rheumatoid factor, unspecified site: Secondary | ICD-10-CM

## 2023-06-29 NOTE — Telephone Encounter (Signed)
Last Fill: 03/18/2023  Eye exam: 07/21/2022 WNL    Labs: 03/15/2023 CBC and CMP are stable.   Next Visit: 08/19/2023  Last Visit: 03/18/2023  DX: Seronegative rheumatoid arthritis   Current Dose per office note 03/18/2023: Plaquenil 200 mg 1 tablet by mouth daily   Okay to refill Plaquenil?

## 2023-07-15 ENCOUNTER — Other Ambulatory Visit: Payer: Self-pay | Admitting: *Deleted

## 2023-07-15 ENCOUNTER — Telehealth: Payer: Self-pay | Admitting: *Deleted

## 2023-07-15 DIAGNOSIS — M0600 Rheumatoid arthritis without rheumatoid factor, unspecified site: Secondary | ICD-10-CM

## 2023-07-15 DIAGNOSIS — Z79899 Other long term (current) drug therapy: Secondary | ICD-10-CM

## 2023-07-15 NOTE — Telephone Encounter (Signed)
Received fax from [facility]. Requesting CBC and CMP results.   Faxed as requested.

## 2023-07-20 DIAGNOSIS — L908 Other atrophic disorders of skin: Secondary | ICD-10-CM | POA: Diagnosis not present

## 2023-07-20 DIAGNOSIS — D225 Melanocytic nevi of trunk: Secondary | ICD-10-CM | POA: Diagnosis not present

## 2023-07-20 DIAGNOSIS — Z1283 Encounter for screening for malignant neoplasm of skin: Secondary | ICD-10-CM | POA: Diagnosis not present

## 2023-07-20 DIAGNOSIS — D2339 Other benign neoplasm of skin of other parts of face: Secondary | ICD-10-CM | POA: Diagnosis not present

## 2023-07-20 DIAGNOSIS — L821 Other seborrheic keratosis: Secondary | ICD-10-CM | POA: Diagnosis not present

## 2023-07-29 ENCOUNTER — Other Ambulatory Visit: Payer: Self-pay | Admitting: *Deleted

## 2023-07-29 DIAGNOSIS — M0600 Rheumatoid arthritis without rheumatoid factor, unspecified site: Secondary | ICD-10-CM

## 2023-07-29 DIAGNOSIS — Z79899 Other long term (current) drug therapy: Secondary | ICD-10-CM

## 2023-08-04 DIAGNOSIS — D0439 Carcinoma in situ of skin of other parts of face: Secondary | ICD-10-CM | POA: Diagnosis not present

## 2023-08-04 DIAGNOSIS — C44321 Squamous cell carcinoma of skin of nose: Secondary | ICD-10-CM | POA: Diagnosis not present

## 2023-08-05 DIAGNOSIS — Z79899 Other long term (current) drug therapy: Secondary | ICD-10-CM | POA: Diagnosis not present

## 2023-08-05 DIAGNOSIS — M0600 Rheumatoid arthritis without rheumatoid factor, unspecified site: Secondary | ICD-10-CM | POA: Diagnosis not present

## 2023-08-05 NOTE — Progress Notes (Unsigned)
Office Visit Note  Patient: Stacey Craig             Date of Birth: 1961/02/24           MRN: [id number]             PCP: Myrlene Broker, MD Referring: Myrlene Broker, * Visit Date: 08/19/2023 Occupation: @GUAROCC @  Subjective:  Discuss DEXA results   History of Present Illness: Stacey Craig is a 62 y.o. female with history of seronegative rheumatoid arthritis.  Patient remains on Plaquenil 200 mg 1 tablet by mouth daily.  She is tolerating without any side effects and has not missed any doses recently.  She denies any signs or symptoms of a rheumatoid arthritis flare.  She has not been experiencing any morning stiffness, nocturnal pain, or difficulty with ADLs.  She denies any joint swelling.  She remains active exercising on regular basis.  Patient states that she is scheduled for an updated Plaquenil examination in November 2024. She presents today to discuss DEXA results from 04/15/2023.  She has continued to take a calcium vitamin D supplement daily. She denies any falls or fractures.   Activities of Daily Living:  Patient reports morning stiffness for 0  none .   Patient Denies nocturnal pain.  Difficulty dressing/grooming: Denies Difficulty climbing stairs: Denies Difficulty getting out of chair: Denies Difficulty using hands for taps, buttons, cutlery, and/or writing: Denies  Review of Systems  Constitutional:  Negative for fatigue.  HENT:  Negative for mouth sores and mouth dryness.   Eyes:  Negative for dryness.  Respiratory:  Negative for shortness of breath.   Cardiovascular:  Negative for chest pain and palpitations.  Gastrointestinal:  Positive for constipation. Negative for blood in stool and diarrhea.  Endocrine: Positive for increased urination.  Genitourinary:  Negative for involuntary urination.  Musculoskeletal:  Negative for joint pain, gait problem, joint pain, joint swelling, myalgias, muscle weakness, morning stiffness,  muscle tenderness and myalgias.  Skin:  Positive for sensitivity to sunlight. Negative for color change, rash and hair loss.  Allergic/Immunologic: Negative for susceptible to infections.  Neurological:  Negative for dizziness and headaches.  Hematological:  Negative for swollen glands.  Psychiatric/Behavioral:  Positive for sleep disturbance. Negative for depressed mood. The patient is not nervous/anxious.     PMFS History:  Patient Active Problem List   Diagnosis Date Noted   Word finding difficulty 06/06/2020   HX: breast cancer 12/20/2019   Routine general medical examination at a health care facility 09/12/2018   Chronic headaches 11/03/2017   Rheumatoid arthritis (HCC) 06/21/2015    Past Medical History:  Diagnosis Date   Arthritis    Breast cancer (HCC)    Colon polyps    Genital warts    UTI (lower urinary tract infection)     Family History  Problem Relation Age of Onset   Cancer Father        brain, bladder, and skin   Heart disease Father    Stroke Father    Cancer Maternal Grandmother        breast   Cancer Paternal Grandmother        lung   Cancer Paternal Grandfather        bone   Past Surgical History:  Procedure Laterality Date   AUGMENTATION MAMMAPLASTY Bilateral    BREAST SURGERY     FOREARM SURGERY Left    SKIN SURGERY  03/2020   chest, back    Social History  Social History Narrative   Right handed    Caffeine- 6 cups daily    Immunization History  Administered Date(s) Administered   PFIZER(Purple Top)SARS-COV-2 Vaccination 01/22/2020, 02/07/2020   Td 10/26/1990   Tdap 12/10/2021   Zoster Recombinant(Shingrix) 09/12/2018, 05/18/2019     Objective: Vital Signs: BP 108/73 (BP Location: Left Arm, Patient Position: Sitting, Cuff Size: Normal)   Pulse 73   Resp 15   Ht 5\' 3"  (1.6 m)   Wt 120 lb 6.4 oz (54.6 kg)   LMP 11/13/2011   BMI 21.33 kg/m    Physical Exam Vitals and nursing note reviewed.  Constitutional:      Appearance:  She is well-developed.  HENT:     Head: Normocephalic and atraumatic.  Eyes:     Conjunctiva/sclera: Conjunctivae normal.  Cardiovascular:     Rate and Rhythm: Normal rate and regular rhythm.     Heart sounds: Normal heart sounds.  Pulmonary:     Effort: Pulmonary effort is normal.     Breath sounds: Normal breath sounds.  Abdominal:     General: Bowel sounds are normal.     Palpations: Abdomen is soft.  Musculoskeletal:     Cervical back: Normal range of motion.  Lymphadenopathy:     Cervical: No cervical adenopathy.  Skin:    General: Skin is warm and dry.     Capillary Refill: Capillary refill takes less than 2 seconds.  Neurological:     Mental Status: She is alert and oriented to person, place, and time.  Psychiatric:        Behavior: Behavior normal.      Musculoskeletal Exam: C-spine, thoracic spine, lumbar spine good range of motion.  Shoulder joints, elbow joints, wrist joints, MCPs, PIPs, DIPs have good range of motion with no synovitis.  Complete fist formation bilaterally.  Hip joints have good range of motion with no groin pain.  Knee joints have good range of motion with no warmth or effusion.  Ankle joints have good range of motion with no tenderness or joint swelling.  No tenderness or synovitis over MTP joints.  No evidence of Achilles tendinitis or plantar fasciitis.  CDAI Exam: CDAI Score: -- Patient Global: --; Provider Global: -- Swollen: --; Tender: -- Joint Exam 08/19/2023   No joint exam has been documented for this visit   There is currently no information documented on the homunculus. Go to the Rheumatology activity and complete the homunculus joint exam.  Investigation: No additional findings.  Imaging: No results found.  Recent Labs: Lab Results  Component Value Date   WBC 4.2 08/05/2023   HGB 13.5 08/05/2023   PLT 222 08/05/2023   NA 139 08/05/2023   K 4.8 08/05/2023   CL 103 08/05/2023   CO2 29 08/05/2023   GLUCOSE 83 08/05/2023    BUN 7 08/05/2023   CREATININE 0.86 08/05/2023   BILITOT 0.5 08/05/2023   ALKPHOS 39 11/27/2019   AST 20 08/05/2023   ALT 18 08/05/2023   PROT 6.2 08/05/2023   ALBUMIN 4.7 11/27/2019   CALCIUM 10.0 08/05/2023   GFRAA 112 02/21/2021    Speciality Comments: PLQ eye exam: 07/21/2022 WNL  Dr. Gar [address]. Follow up in 12 months. Appt 09/19/2023 per patient  Procedures:  No procedures performed Allergies: Patient has no known allergies.   Assessment / Plan:     Visit Diagnoses: Seronegative rheumatoid arthritis (HCC): She has no synovitis on examination today.  She has not had any signs or symptoms of a  rheumatoid arthritis flare.  She has clinically been doing well taking Plaquenil 200 mg 1 tablet by mouth daily.  She is tolerating Plaquenil without any side effects and has not missed any doses recently.  She has not been experiencing any morning stiffness, nocturnal pain, or difficulty with ADLs.  She remains active exercising on a regular basis without difficulty.  She is scheduled to update her Plaquenil eye examination in November 2024.  No medication changes will be made at this time.  She was vies notify us if she develops signs or symptoms of a flare.  She will follow-up in the office in 5 months or sooner if needed.  High risk medication use - Plaquenil 200 mg 1 tablet by mouth daily. CBC and CMP were updated on 08/05/2023--results were discussed with the patient today. PLQ eye exam: 07/21/2022 WNL Dr. Sheral Flow. Follow up in 12 months.  Patient is scheduled for updated Plaquenil examination in November 2024.  She was given an eye examination form to take with her to her next appointment.  Primary osteoarthritis of both knees: She has good range of motion of both knee joints on examination today.  No warmth or effusion noted.  Osteopenia of multiple sites - DEXA on 12/12/18: total left hip BMD 0.715 with T-score of -1.9. DEXA: 12/13/2020 T-score: -1.9, BMD: 0.637 right femoral neck.   DEXA updated on 04/15/2023: Left total hip BMD 0.669 with T-score -2.3.  -7% change in BMD for total left femur.  -9% change in BMD for right total femur.  -6% change in BMD for AP spine. DEXA results were discussed today in detail and all questions were addressed.  She has been taking calcium vitamin D supplements daily. Vitamin D was 62 on 08/05/2023 and Calcium was 10.1.   Discussed the option of initiating Fosamax but she would like to hold off on any prescription medications at this time.   She remains active exercising on a regular basis.  Discussed the importance of performing resistive exercises.  Patient plans on continuing calcium and vitamin D supplementation.  Her next DEXA will be due in June 2026.  Other medical conditions are listed as follows:  Chronic nonintractable headache, unspecified headache type  Malignant neoplasm of upper-outer quadrant of left breast in female, estrogen receptor positive (HCC) - dxd 02/21 mastectomy and RTX, no CTX  Orders: No orders of the defined types were placed in this encounter.  No orders of the defined types were placed in this encounter.    Follow-Up Instructions: Return in about 5 months (around 01/17/2024) for Rheumatoid arthritis.   Gearldine Bienenstock, PA-C  Note - This record has been created using Dragon software.  Chart creation errors have been sought, but may not always  have been located. Such creation errors do not reflect on  the standard of medical care.

## 2023-08-06 LAB — CBC WITH DIFFERENTIAL/PLATELET
Absolute Monocytes: 340 {cells}/uL (ref 200–950)
Basophils Absolute: 21 {cells}/uL (ref 0–200)
Basophils Relative: 0.5 %
Eosinophils Absolute: 386 {cells}/uL (ref 15–500)
Eosinophils Relative: 9.2 %
HCT: 39.9 % (ref 35.0–45.0)
Hemoglobin: 13.5 g/dL (ref 11.7–15.5)
Lymphs Abs: 1823 {cells}/uL (ref 850–3900)
MCH: 30.6 pg (ref 27.0–33.0)
MCHC: 33.8 g/dL (ref 32.0–36.0)
MCV: 90.5 fL (ref 80.0–100.0)
MPV: 9.9 fL (ref 7.5–12.5)
Monocytes Relative: 8.1 %
Neutro Abs: 1630 {cells}/uL (ref 1500–7800)
Neutrophils Relative %: 38.8 %
Platelets: 222 10*3/uL (ref 140–400)
RBC: 4.41 10*6/uL (ref 3.80–5.10)
RDW: 11.5 % (ref 11.0–15.0)
Total Lymphocyte: 43.4 %
WBC: 4.2 10*3/uL (ref 3.8–10.8)

## 2023-08-06 LAB — COMPLETE METABOLIC PANEL WITH GFR
AG Ratio: 2.6 (calc) — ABNORMAL HIGH (ref 1.0–2.5)
ALT: 18 U/L (ref 6–29)
AST: 20 U/L (ref 10–35)
Albumin: 4.5 g/dL (ref 3.6–5.1)
Alkaline phosphatase (APISO): 45 U/L (ref 37–153)
BUN: 7 mg/dL (ref 7–25)
CO2: 29 mmol/L (ref 20–32)
Calcium: 10 mg/dL (ref 8.6–10.4)
Chloride: 103 mmol/L (ref 98–110)
Creat: 0.86 mg/dL (ref 0.50–1.05)
Globulin: 1.7 g/dL — ABNORMAL LOW (ref 1.9–3.7)
Glucose, Bld: 83 mg/dL (ref 65–99)
Potassium: 4.8 mmol/L (ref 3.5–5.3)
Sodium: 139 mmol/L (ref 135–146)
Total Bilirubin: 0.5 mg/dL (ref 0.2–1.2)
Total Protein: 6.2 g/dL (ref 6.1–8.1)
eGFR: 77 mL/min/{1.73_m2} (ref >=60)

## 2023-08-06 LAB — LIPID PANEL
Cholesterol: 186 mg/dL (ref <200)
HDL: 79 mg/dL (ref >=50)
LDL Cholesterol (Calc): 92 mg/dL
Non-HDL Cholesterol (Calc): 107 mg/dL (ref <130)
Total CHOL/HDL Ratio: 2.4 (calc) (ref <5.0)
Triglycerides: 60 mg/dL (ref <150)

## 2023-08-06 LAB — VITAMIN D 25 HYDROXY (VIT D DEFICIENCY, FRACTURES): Vit D, 25-Hydroxy: 62 ng/mL (ref 30–100)

## 2023-08-06 NOTE — Progress Notes (Signed)
CMP is stable.  CBC is normal.  Vitamin D is 62 which is in the desirable range.  LDL was 92.  Please forward results to her PCP.

## 2023-08-19 ENCOUNTER — Encounter: Payer: Self-pay | Admitting: Physician Assistant

## 2023-08-19 ENCOUNTER — Ambulatory Visit: Payer: BC Managed Care – PPO | Attending: Physician Assistant | Admitting: Physician Assistant

## 2023-08-19 VITALS — BP 108/73 | HR 73 | Resp 15 | Ht 63.0 in | Wt 120.4 lb

## 2023-08-19 DIAGNOSIS — R519 Headache, unspecified: Secondary | ICD-10-CM

## 2023-08-19 DIAGNOSIS — M170 Bilateral primary osteoarthritis of knee: Secondary | ICD-10-CM

## 2023-08-19 DIAGNOSIS — Z79899 Other long term (current) drug therapy: Secondary | ICD-10-CM | POA: Diagnosis not present

## 2023-08-19 DIAGNOSIS — G8929 Other chronic pain: Secondary | ICD-10-CM

## 2023-08-19 DIAGNOSIS — Z170 Estrogen receptor positive status [ER+]: Secondary | ICD-10-CM

## 2023-08-19 DIAGNOSIS — M8589 Other specified disorders of bone density and structure, multiple sites: Secondary | ICD-10-CM

## 2023-08-19 DIAGNOSIS — M0600 Rheumatoid arthritis without rheumatoid factor, unspecified site: Secondary | ICD-10-CM

## 2023-08-19 DIAGNOSIS — C50412 Malignant neoplasm of upper-outer quadrant of left female breast: Secondary | ICD-10-CM

## 2023-09-23 ENCOUNTER — Other Ambulatory Visit: Payer: Self-pay | Admitting: Physician Assistant

## 2023-09-23 DIAGNOSIS — M0600 Rheumatoid arthritis without rheumatoid factor, unspecified site: Secondary | ICD-10-CM

## 2023-09-27 NOTE — Telephone Encounter (Signed)
Last Fill: 06/29/2023  Eye exam: 09/21/2023 WNL    Labs: 08/05/2023 CMP is stable.  CBC is normal   Next Visit: Due March 2025. Message sent to the front to schedule.   Last Visit: 08/19/2023  DX: Seronegative rheumatoid arthritis   Current Dose per office note 08/19/2023: Plaquenil 200 mg 1 tablet by mouth daily.   Okay to refill Plaquenil?

## 2023-09-27 NOTE — Telephone Encounter (Signed)
Please schedule patient a follow up visit. Patient due March 2025. Thanks!   Follow-Up Instructions: Return in about 5 months (around 01/17/2024) for Rheumatoid arthritis.

## 2023-09-27 NOTE — Telephone Encounter (Signed)
Attempted to contact patient and left message to advise patient to call the office and schedule patient a follow up appointment.

## 2023-12-07 ENCOUNTER — Other Ambulatory Visit: Payer: Self-pay | Admitting: Obstetrics and Gynecology

## 2023-12-07 DIAGNOSIS — Z0000 Encounter for general adult medical examination without abnormal findings: Secondary | ICD-10-CM

## 2024-01-03 NOTE — Progress Notes (Signed)
 Office Visit Note  Patient: Stacey Craig             Date of Birth: [birth year]           MRN: [id number]             PCP: Myrlene Broker, MD Referring: Myrlene Broker, * Visit Date: 01/17/2024 Occupation: @GUAROCC @  Subjective:  Medication monitoring   History of Present Illness: Stacey Craig is a 63 y.o. female with history of seronegative rheumatoid arthritis. Patient remains on Plaquenil 200 mg 1 tablet by mouth daily.  She is tolerating Plaquenil without any side effects and has not had any recent gaps in therapy.  Patient denies any signs or symptoms of a rheumatoid arthritis flare.  She denies any joint swelling at this time.  Patient states that she was recently evaluated by functional medicine doctor and had a panel of labs drawn on 11/16/2023 and on 11/23/2023.  Patient reports that she was found to have a positive rheumatoid factor.  She plans on upcoming the plan of lab results to mychart.  She remains active exercising on a daily basis.     Activities of Daily Living:  Patient reports morning stiffness for 0 minute.   Patient Denies nocturnal pain.  Difficulty dressing/grooming: Denies Difficulty climbing stairs: Denies Difficulty getting out of chair: Denies Difficulty using hands for taps, buttons, cutlery, and/or writing: Denies  Review of Systems  Constitutional:  Negative for fatigue.  HENT:  Negative for mouth sores and mouth dryness.   Eyes:  Negative for dryness.  Respiratory:  Negative for shortness of breath.   Cardiovascular:  Negative for chest pain and palpitations.  Gastrointestinal:  Positive for constipation. Negative for blood in stool and diarrhea.  Endocrine: Negative for increased urination.  Genitourinary:  Negative for involuntary urination.  Musculoskeletal:  Negative for joint pain, gait problem, joint pain, joint swelling, myalgias, muscle weakness, morning stiffness, muscle tenderness and myalgias.  Skin:   Negative for color change, rash, hair loss and sensitivity to sunlight.  Allergic/Immunologic: Negative for susceptible to infections.  Neurological:  Negative for dizziness and headaches.  Hematological:  Negative for swollen glands.  Psychiatric/Behavioral:  Negative for depressed mood and sleep disturbance. The patient is not nervous/anxious.     PMFS History:  Patient Active Problem List   Diagnosis Date Noted   Ventral hernia 01/14/2024   Word finding difficulty 06/06/2020   HX: breast cancer 12/20/2019   Routine general medical examination at a health care facility 09/12/2018   Chronic headaches 11/03/2017   Rheumatoid arthritis (HCC) 06/21/2015    Past Medical History:  Diagnosis Date   Arthritis    Breast cancer (HCC)    Colon polyps    Genital warts    Personal history of radiation therapy    UTI (lower urinary tract infection)     Family History  Problem Relation Age of Onset   Cancer Father        skin   Heart disease Father    Stroke Father    Brain cancer Father    Bladder Cancer Father    Breast cancer Maternal Grandmother 20 - 69   Lung cancer Paternal Grandmother    Bone cancer Paternal Grandfather    Breast cancer Cousin 46   Past Surgical History:  Procedure Laterality Date   AUGMENTATION MAMMAPLASTY Bilateral 2002   BREAST BIOPSY Right 2011   BREAST LUMPECTOMY Left 12/04/2019   BREAST SURGERY     FOREARM  SURGERY Left    SKIN SURGERY  03/2020   chest, back    Social History   Social History Narrative   Right handed    Caffeine- 6 cups daily    Immunization History  Administered Date(s) Administered   PFIZER(Purple Top)SARS-COV-2 Vaccination 01/22/2020, 02/07/2020   Td 10/26/1990   Tdap 12/10/2021   Zoster Recombinant(Shingrix) 09/12/2018, 05/18/2019     Objective: Vital Signs: BP 104/67 (BP Location: Left Arm, Patient Position: Sitting, Cuff Size: Normal)   Pulse 82   Resp 14   Ht 5\' 3"  (1.6 m)   Wt 121 lb (54.9 kg)   LMP  11/13/2011   BMI 21.43 kg/m    Physical Exam Vitals and nursing note reviewed.  Constitutional:      Appearance: She is well-developed.  HENT:     Head: Normocephalic and atraumatic.  Eyes:     Conjunctiva/sclera: Conjunctivae normal.  Cardiovascular:     Rate and Rhythm: Normal rate and regular rhythm.     Heart sounds: Normal heart sounds.  Pulmonary:     Effort: Pulmonary effort is normal.     Breath sounds: Normal breath sounds.  Abdominal:     General: Bowel sounds are normal.     Palpations: Abdomen is soft.  Musculoskeletal:     Cervical back: Normal range of motion.  Lymphadenopathy:     Cervical: No cervical adenopathy.  Skin:    General: Skin is warm and dry.     Capillary Refill: Capillary refill takes less than 2 seconds.  Neurological:     Mental Status: She is alert and oriented to person, place, and time.  Psychiatric:        Behavior: Behavior normal.      Musculoskeletal Exam: C-spine, thoracic spine, and lumbar spine good ROM.  Shoulder joints, elbow joints, wrist joints, MCPs, PIPs, and DIPs good ROM with no synovitis.  Complete fist formation bilaterally. Hip joints have good ROM with no groin pain.  Knee joints have good ROM with no warmth or effusion.  Ankle joints have good ROM with no tenderness or joint swelling.    CDAI Exam: CDAI Score: -- Patient Global: --; Provider Global: -- Swollen: --; Tender: -- Joint Exam 01/17/2024   No joint exam has been documented for this visit   There is currently no information documented on the homunculus. Go to the Rheumatology activity and complete the homunculus joint exam.  Investigation: No additional findings.  Imaging: MM 3D SCREEN BREAST W/IMPLANT BILATERAL Result Date: 01/17/2024 CLINICAL DATA:  Screening. EXAM: DIGITAL SCREENING BILATERAL MAMMOGRAM WITH IMPLANTS, CAD AND TOMOSYNTHESIS TECHNIQUE: Bilateral screening digital craniocaudal and mediolateral oblique mammograms were obtained. Bilateral  screening digital breast tomosynthesis was performed. The images were evaluated with computer-aided detection. Standard and/or implant displaced views were performed. COMPARISON:  Previous exam(s). ACR Breast Density Category b: There are scattered areas of fibroglandular density. FINDINGS: The patient has retropectoral implants. There are no findings suspicious for malignancy. IMPRESSION: No mammographic evidence of malignancy. A result letter of this screening mammogram will be mailed directly to the patient. RECOMMENDATION: Screening mammogram in one year. (Code:SM-B-01Y) BI-RADS CATEGORY  1: Negative. Electronically Signed   By: Emmaline Kluver M.D.   On: 01/17/2024 10:40    Recent Labs: Lab Results  Component Value Date   WBC 4.2 08/05/2023   HGB 13.5 08/05/2023   PLT 222 08/05/2023   NA 139 08/05/2023   K 4.8 08/05/2023   CL 103 08/05/2023   CO2 29 08/05/2023  GLUCOSE 83 08/05/2023   BUN 7 08/05/2023   CREATININE 0.86 08/05/2023   BILITOT 0.5 08/05/2023   ALKPHOS 39 11/27/2019   AST 20 08/05/2023   ALT 18 08/05/2023   PROT 6.2 08/05/2023   ALBUMIN 4.7 11/27/2019   CALCIUM 10.0 08/05/2023   GFRAA 112 02/21/2021    Speciality Comments: PLQ eye exam: 09/21/2023 WNL  Dr. Sheral Flow. Follow up in 12 months. Appt 09/19/2023 per patient  Procedures:  No procedures performed Allergies: Patient has no known allergies.    Assessment / Plan:     Visit Diagnoses: Seronegative rheumatoid arthritis (HCC): RF 16:She has no joint tenderness or synovitis on examination today.  She has not had any signs or symptoms of a rheumatoid arthritis flare.  She has clinically been doing well taking Plaquenil 200 mg 1 tablet by mouth daily.  No difficulty performing ADLs or increased morning stiffness.  She continues to exercise on a daily basis.  Patient established care with a functional medicine provider who obtained a panel of labs including a rheumatoid factor.  Her rheumatoid factor was 16  and CRP was within normal limits on 11/16/2023.  Plan to update x-rays of both hands and both feet at her follow-up visit to assess for radiographic progression.  No medication changes will be made at this time.  She was advised to notify us if she develops signs or symptoms of a flare.  She will follow-up in the office in 5 months or sooner if needed.  High risk medication use - Plaquenil 200 mg 1 tablet by mouth daily. Rheumatoid factor was 16 and CRP was within normal limits on 11/16/2023. PLQ eye exam: 09/21/2023 WNL Dr. Sheral Flow. Follow up in 12 months.  CBC and CMP updated on 11/16/23--ordered by functional medicine provider-reviewed results in the patient's phone today.  She will be uploading the results to MyChart.  She is aware that she will require updated CBC and CMP in June 2025. Lipid panel updated on 11/16/23.   - Plan: CBC with Differential/Platelet, COMPLETE METABOLIC PANEL WITH GFR  Primary osteoarthritis of both knees: She has good range of motion of both knee joints on examination today.  No warmth or effusion noted. Fullness behind right knee-possible baker's cyst.  Patient has been walking on a daily basis for exercise.   Osteopenia of multiple sites: DEXA on 12/12/18: total left hip BMD 0.715 with T-score of -1.9. DEXA: 12/13/2020 T-score: -1.9, BMD: 0.637 right femoral neck.  DEXA updated on 04/15/2023: Left total hip BMD 0.669 with T-score -2.3.  -7% change in BMD for total left femur.  -9% change in BMD for right total femur.  -6% change in BMD for AP spine. She has been taking calcium vitamin D supplements daily. Vitamin D was 71 on 11/16/2023. Previously discussed the option of initiating Fosamax but she would like to hold off on any prescription medications at this time.   She remains active exercising on a daily basis.  Discussed the importance of performing resistive exercises.  Patient plans on continuing calcium and vitamin D supplementation.  Her next DEXA will be due  in June 2026.  Discussed that she may benefit from establishing care at [facility].  Other medical conditions are listed as follows:   Chronic nonintractable headache, unspecified headache type  Malignant neoplasm of upper-outer quadrant of left breast in female, estrogen receptor positive (HCC)  Orders: Orders Placed This Encounter  Procedures   CBC with Differential/Platelet   COMPLETE METABOLIC PANEL WITH GFR  No orders of the defined types were placed in this encounter.    Follow-Up Instructions: Return in about 5 months (around 06/18/2024) for Rheumatoid arthritis, Osteoporosis.   Gearldine Bienenstock, PA-C  Note - This record has been created using Dragon software.  Chart creation errors have been sought, but may not always  have been located. Such creation errors do not reflect on

## 2024-01-10 ENCOUNTER — Ambulatory Visit: Admitting: Internal Medicine

## 2024-01-10 ENCOUNTER — Encounter: Payer: Self-pay | Admitting: Internal Medicine

## 2024-01-10 VITALS — BP 112/80 | HR 63 | Temp 97.6°F | Wt 119.0 lb

## 2024-01-10 DIAGNOSIS — K439 Ventral hernia without obstruction or gangrene: Secondary | ICD-10-CM

## 2024-01-10 DIAGNOSIS — M0600 Rheumatoid arthritis without rheumatoid factor, unspecified site: Secondary | ICD-10-CM

## 2024-01-10 DIAGNOSIS — Z0000 Encounter for general adult medical examination without abnormal findings: Secondary | ICD-10-CM | POA: Diagnosis not present

## 2024-01-10 DIAGNOSIS — Z853 Personal history of malignant neoplasm of breast: Secondary | ICD-10-CM | POA: Diagnosis not present

## 2024-01-10 NOTE — Progress Notes (Unsigned)
   Subjective:   Stacey Craig ID: Stacey Stacey Craig, female    DOB: 06-23-1961, 63 y.o.   MRN: [id number]  HPI The Stacey Craig is here for physical. Also has a new spot on her stomach about the size of a golf ball wants checked.   PMH, [facility], social history reviewed and updated  Review of Systems  Objective:  Physical Exam  Vitals:   01/10/24 1057  BP: 112/80  Pulse: 63  Temp: 97.6 F (36.4 C)  TempSrc: Oral  SpO2: 99%  Weight: 119 lb (54 kg)    Assessment & Plan:

## 2024-01-13 ENCOUNTER — Ambulatory Visit
Admission: RE | Admit: 2024-01-13 | Discharge: 2024-01-13 | Disposition: A | Discharge status: Home / Self Care | Payer: BC Managed Care – PPO | Source: Ambulatory Visit | Attending: Obstetrics and Gynecology | Admitting: Obstetrics and Gynecology

## 2024-01-13 DIAGNOSIS — Z0000 Encounter for general adult medical examination without abnormal findings: Secondary | ICD-10-CM

## 2024-01-13 DIAGNOSIS — Z1231 Encounter for screening mammogram for malignant neoplasm of breast: Secondary | ICD-10-CM | POA: Diagnosis not present

## 2024-01-13 HISTORY — DX: Personal history of irradiation: Z92.3

## 2024-01-14 ENCOUNTER — Encounter: Payer: Self-pay | Admitting: Internal Medicine

## 2024-01-14 DIAGNOSIS — K439 Ventral hernia without obstruction or gangrene: Secondary | ICD-10-CM | POA: Insufficient documentation

## 2024-01-14 NOTE — Assessment & Plan Note (Signed)
 Continue yearly mammogram and up to date.

## 2024-01-14 NOTE — Assessment & Plan Note (Signed)
 Reassurance given that this does not require treatment. We discussed her chronic constipation could impact with straining. She is working on adding miralax to help with this encouraged regularity. If growing or pain can ocnsider surgery referral in the future.

## 2024-01-14 NOTE — Assessment & Plan Note (Signed)
 Taking plaquenil and labs with rheum up to date. Eye exam yearly without abnormality.

## 2024-01-14 NOTE — Assessment & Plan Note (Signed)
 Flu shot up to date. Shingrix compelte. Tetanus up to date. Colonoscopy up to date. Mammogram up to date, pap smear up to date with gyn. Counseled about sun safety and mole surveillance. Counseled about the dangers of distracted driving. Given 10 year screening recommendations.

## 2024-01-17 ENCOUNTER — Encounter: Payer: Self-pay | Admitting: Internal Medicine

## 2024-01-17 ENCOUNTER — Ambulatory Visit: Payer: BC Managed Care – PPO | Attending: Physician Assistant | Admitting: Physician Assistant

## 2024-01-17 ENCOUNTER — Encounter: Payer: Self-pay | Admitting: Physician Assistant

## 2024-01-17 VITALS — BP 104/67 | HR 82 | Resp 14 | Ht 63.0 in | Wt 121.0 lb

## 2024-01-17 DIAGNOSIS — M170 Bilateral primary osteoarthritis of knee: Secondary | ICD-10-CM | POA: Diagnosis not present

## 2024-01-17 DIAGNOSIS — Z170 Estrogen receptor positive status [ER+]: Secondary | ICD-10-CM

## 2024-01-17 DIAGNOSIS — C50412 Malignant neoplasm of upper-outer quadrant of left female breast: Secondary | ICD-10-CM

## 2024-01-17 DIAGNOSIS — Z79899 Other long term (current) drug therapy: Secondary | ICD-10-CM

## 2024-01-17 DIAGNOSIS — M0600 Rheumatoid arthritis without rheumatoid factor, unspecified site: Secondary | ICD-10-CM

## 2024-01-17 DIAGNOSIS — G8929 Other chronic pain: Secondary | ICD-10-CM

## 2024-01-17 DIAGNOSIS — R519 Headache, unspecified: Secondary | ICD-10-CM

## 2024-01-17 DIAGNOSIS — M8589 Other specified disorders of bone density and structure, multiple sites: Secondary | ICD-10-CM | POA: Diagnosis not present

## 2024-01-17 DIAGNOSIS — M0579 Rheumatoid arthritis with rheumatoid factor of multiple sites without organ or systems involvement: Secondary | ICD-10-CM | POA: Diagnosis not present

## 2024-01-17 LAB — LIPID PANEL
HDL: 68 (ref 35–70)
LDL Cholesterol: 98
LDl/HDL Ratio: 2.7
Triglycerides: 82 (ref 40–160)

## 2024-01-17 LAB — CBC AND DIFFERENTIAL
HCT: 37 (ref 36–46)
Hemoglobin: 13 (ref 12.0–16.0)
Platelets: 240 10*3/uL (ref 150–400)
WBC: 3.4

## 2024-01-17 LAB — BASIC METABOLIC PANEL WITH GFR
BUN: 7 (ref 4–21)
CO2: 28 — AB (ref 13–22)
Chloride: 105 (ref 99–108)
Creatinine: 0.7 (ref 0.5–1.1)
Potassium: 4.1 meq/L (ref 3.5–5.1)
Sodium: 140 (ref 137–147)

## 2024-01-17 LAB — HEPATIC FUNCTION PANEL
Alkaline Phosphatase: 36 (ref 25–125)
Bilirubin, Total: 0.6

## 2024-01-17 LAB — HEMOGLOBIN A1C: Hemoglobin A1C: 5.1

## 2024-01-17 LAB — COMPREHENSIVE METABOLIC PANEL WITH GFR
Calcium: 9.9 (ref 8.7–10.7)
Globulin: 1.7

## 2024-01-17 NOTE — Patient Instructions (Signed)

## 2024-01-19 NOTE — Telephone Encounter (Signed)
 Abstract  this for you

## 2024-01-25 DIAGNOSIS — L84 Corns and callosities: Secondary | ICD-10-CM | POA: Diagnosis not present

## 2024-01-25 DIAGNOSIS — B078 Other viral warts: Secondary | ICD-10-CM | POA: Diagnosis not present

## 2024-02-14 ENCOUNTER — Encounter: Payer: Self-pay | Admitting: Internal Medicine

## 2024-03-03 ENCOUNTER — Other Ambulatory Visit: Payer: Self-pay | Admitting: Physician Assistant

## 2024-03-03 DIAGNOSIS — M0600 Rheumatoid arthritis without rheumatoid factor, unspecified site: Secondary | ICD-10-CM

## 2024-03-03 NOTE — Telephone Encounter (Signed)
 Last Fill: 09/27/2023  Eye exam: 09/21/2023 WNL   Labs: 01/17/2024 CO2 28  Next Visit: 07/05/2024  Last Visit: 01/17/2024  [id number] rheumatoid arthritis   Current Dose per office note 01/17/2024: Plaquenil  200 mg 1 tablet by mouth daily.   Okay to refill Plaquenil ?

## 2024-05-08 DIAGNOSIS — Z01419 Encounter for gynecological examination (general) (routine) without abnormal findings: Secondary | ICD-10-CM | POA: Diagnosis not present

## 2024-05-08 DIAGNOSIS — R87810 Cervical high risk human papillomavirus (HPV) DNA test positive: Secondary | ICD-10-CM | POA: Diagnosis not present

## 2024-05-08 DIAGNOSIS — Z1331 Encounter for screening for depression: Secondary | ICD-10-CM | POA: Diagnosis not present

## 2024-06-01 ENCOUNTER — Other Ambulatory Visit: Payer: Self-pay | Admitting: Rheumatology

## 2024-06-01 DIAGNOSIS — M0600 Rheumatoid arthritis without rheumatoid factor, unspecified site: Secondary | ICD-10-CM

## 2024-06-01 NOTE — Telephone Encounter (Signed)
 Last Fill: 03/06/2024  Eye exam: 09/21/2023 WNL   Labs: 11/16/2023 Albumin/Globulin Ratio 2.8, Alk. Phos 36, Globulin 1.7  Next Visit: 07/05/2024  Last Visit: 01/17/2024  DX: Seronegative rheumatoid arthritis   Current Dose per office note 01/17/2024: Plaquenil  200 mg 1 tablet by mouth daily   Left message to advise patient she is due to update labs.   Okay to refill Plaquenil ?

## 2024-06-05 ENCOUNTER — Other Ambulatory Visit: Payer: Self-pay | Admitting: *Deleted

## 2024-06-05 DIAGNOSIS — Z79899 Other long term (current) drug therapy: Secondary | ICD-10-CM | POA: Diagnosis not present

## 2024-06-05 LAB — COMPREHENSIVE METABOLIC PANEL WITH GFR
AG Ratio: 3 (calc) — ABNORMAL HIGH (ref 1.0–2.5)
ALT: 16 U/L (ref 6–29)
AST: 20 U/L (ref 10–35)
Albumin: 4.8 g/dL (ref 3.6–5.1)
Alkaline phosphatase (APISO): 35 U/L — ABNORMAL LOW (ref 37–153)
BUN: 7 mg/dL (ref 7–25)
CO2: 28 mmol/L (ref 20–32)
Calcium: 9.7 mg/dL (ref 8.6–10.4)
Chloride: 102 mmol/L (ref 98–110)
Creat: 0.71 mg/dL (ref 0.50–1.05)
Globulin: 1.6 g/dL — ABNORMAL LOW (ref 1.9–3.7)
Glucose, Bld: 93 mg/dL (ref 65–99)
Potassium: 4.6 mmol/L (ref 3.5–5.3)
Sodium: 139 mmol/L (ref 135–146)
Total Bilirubin: 0.6 mg/dL (ref 0.2–1.2)
Total Protein: 6.4 g/dL (ref 6.1–8.1)
eGFR: 97 mL/min/1.73m2 (ref >=60)

## 2024-06-05 LAB — CBC WITH DIFFERENTIAL/PLATELET
Absolute Lymphocytes: 1773 {cells}/uL (ref 850–3900)
Absolute Monocytes: 330 {cells}/uL (ref 200–950)
Basophils Absolute: 40 {cells}/uL (ref 0–200)
Basophils Relative: 0.9 %
Eosinophils Absolute: 101 {cells}/uL (ref 15–500)
Eosinophils Relative: 2.3 %
HCT: 39.3 % (ref 35.0–45.0)
Hemoglobin: 13.4 g/dL (ref 11.7–15.5)
MCH: 31.7 pg (ref 27.0–33.0)
MCHC: 34.1 g/dL (ref 32.0–36.0)
MCV: 92.9 fL (ref 80.0–100.0)
MPV: 9.5 fL (ref 7.5–12.5)
Monocytes Relative: 7.5 %
Neutro Abs: 2156 {cells}/uL (ref 1500–7800)
Neutrophils Relative %: 49 %
Platelets: 249 Thousand/uL (ref 140–400)
RBC: 4.23 Million/uL (ref 3.80–5.10)
RDW: 11.8 % (ref 11.0–15.0)
Total Lymphocyte: 40.3 %
WBC: 4.4 Thousand/uL (ref 3.8–10.8)

## 2024-06-06 ENCOUNTER — Ambulatory Visit: Payer: Self-pay | Admitting: Physician Assistant

## 2024-06-06 NOTE — Progress Notes (Signed)
 CBC WNL Globulin is borderline low.  Alk phos is borderline low. Rest of CMP WNL.  We will continue to monitor.

## 2024-06-22 NOTE — Progress Notes (Signed)
 Office Visit Note  Patient: Stacey Craig             Date of Birth: [birth year]           MRN: [id number]             PCP: Rollene Almarie LABOR, MD Referring: Rollene Almarie LABOR, * Visit Date: 07/05/2024 Occupation: @GUAROCC @  Subjective:  Medication management  History of Present Illness: Stacey Craig is a 63 y.o. female with seropositive rheumatoid arthritis.  She states she has been doing well as regards to rheumatoid arthritis.  She has not had any flares.  She denies any joint pain or joint swelling.  She has been taking Plaquenil  200 mg p.o. daily without any interruption.    Activities of Daily Living:  Patient reports morning stiffness for 0 minutes.   Patient Denies nocturnal pain.  Difficulty dressing/grooming: Denies Difficulty climbing stairs: Denies Difficulty getting out of chair: Denies Difficulty using hands for taps, buttons, cutlery, and/or writing: Denies  Review of Systems  Constitutional:  Negative for fatigue.  HENT:  Negative for mouth sores and mouth dryness.   Eyes:  Negative for dryness.  Respiratory:  Negative for shortness of breath.   Cardiovascular:  Negative for chest pain and palpitations.  Gastrointestinal:  Negative for blood in stool, constipation and diarrhea.  Endocrine: Positive for increased urination.  Genitourinary:  Negative for involuntary urination.  Musculoskeletal:  Negative for joint pain, gait problem, joint pain, joint swelling, myalgias, muscle weakness, morning stiffness, muscle tenderness and myalgias.  Skin:  Negative for color change, rash, hair loss and sensitivity to sunlight.  Allergic/Immunologic: Negative for susceptible to infections.  Neurological:  Negative for dizziness and headaches.  Hematological:  Negative for swollen glands.  Psychiatric/Behavioral:  Negative for depressed mood and sleep disturbance. The patient is not nervous/anxious.     PMFS History:  Patient Active Problem List    Diagnosis Date Noted   Ventral hernia 01/14/2024   Word finding difficulty 06/06/2020   HX: breast cancer 12/20/2019   Routine general medical examination at a health care facility 09/12/2018   Chronic headaches 11/03/2017   Rheumatoid arthritis (HCC) 06/21/2015    Past Medical History:  Diagnosis Date   Arthritis    Breast cancer (HCC)    Colon polyps    Genital warts    Personal history of radiation therapy    UTI (lower urinary tract infection)     Family History  Problem Relation Age of Onset   Cancer Father        skin   Heart disease Father    Stroke Father    Brain cancer Father    Bladder Cancer Father    Breast cancer Maternal Grandmother 69 - 69   Cancer Maternal Grandmother    Lung cancer Paternal Grandmother    Cancer Paternal Grandmother    Bone cancer Paternal Grandfather    Cancer Paternal Grandfather    Breast cancer Cousin 65   Cancer Daughter    Past Surgical History:  Procedure Laterality Date   AUGMENTATION MAMMAPLASTY Bilateral 2002   BREAST BIOPSY Right 2011   BREAST LUMPECTOMY Left 12/04/2019   BREAST SURGERY     COSMETIC SURGERY  breast augmentation   around 2001   FOREARM SURGERY Left    SKIN SURGERY  03/2020   chest, back    Social History   Social History Narrative   Right handed    Caffeine- 6 cups daily    Immunization  History  Administered Date(s) Administered   PFIZER(Purple Top)SARS-COV-2 Vaccination 01/22/2020, 02/07/2020   Td 10/26/1990   Tdap 12/10/2021   Zoster Recombinant(Shingrix ) 09/12/2018, 05/18/2019     Objective: Vital Signs: BP 101/68 (BP Location: Left Arm, Patient Position: Sitting, Cuff Size: Small)   Pulse 75   Temp 98.3 F (36.8 C)   Resp 12   Ht 5' 3 (1.6 m)   Wt 121 lb (54.9 kg)   LMP 11/13/2011   BMI 21.43 kg/m    Physical Exam Vitals and nursing note reviewed.  Constitutional:      Appearance: She is well-developed.  HENT:     Head: Normocephalic and atraumatic.  Eyes:      Conjunctiva/sclera: Conjunctivae normal.  Cardiovascular:     Rate and Rhythm: Normal rate and regular rhythm.     Heart sounds: Normal heart sounds.  Pulmonary:     Effort: Pulmonary effort is normal.     Breath sounds: Normal breath sounds.  Abdominal:     General: Bowel sounds are normal.     Palpations: Abdomen is soft.  Musculoskeletal:     Cervical back: Normal range of motion.  Lymphadenopathy:     Cervical: No cervical adenopathy.  Skin:    General: Skin is warm and dry.     Capillary Refill: Capillary refill takes less than 2 seconds.  Neurological:     Mental Status: She is alert and oriented to person, place, and time.  Psychiatric:        Behavior: Behavior normal.      Musculoskeletal Exam: Cervical, thoracic and lumbar spine were in good range of motion.  There was no SI joint tenderness.  Shoulder joints, elbow joints, wrist joints, MCPs, PIPs and DIPs were in good range of motion with no synovitis.  Hip joints and knee joints were in good range of motion without any warmth swelling or effusion.  There was no tenderness over ankles or MTPs.   CDAI Exam: CDAI Score: -- Patient Global: --; Provider Global: -- Swollen: --; Tender: -- Joint Exam 07/05/2024   No joint exam has been documented for this visit   There is currently no information documented on the homunculus. Go to the Rheumatology activity and complete the homunculus joint exam.  Investigation: No additional findings.  Imaging: No results found.  Recent Labs: Lab Results  Component Value Date   WBC 4.4 06/05/2024   HGB 13.4 06/05/2024   PLT 249 06/05/2024   NA 139 06/05/2024   K 4.6 06/05/2024   CL 102 06/05/2024   CO2 28 06/05/2024   GLUCOSE 93 06/05/2024   BUN 7 06/05/2024   CREATININE 0.71 06/05/2024   BILITOT 0.6 06/05/2024   ALKPHOS 36 01/17/2024   AST 20 06/05/2024   ALT 16 06/05/2024   PROT 6.4 06/05/2024   ALBUMIN 4.7 11/27/2019   CALCIUM 9.7 06/05/2024   GFRAA 112  02/21/2021    Speciality Comments: PLQ eye exam: 09/21/2023 WNL  Dr. [address]. Follow up in 12 months. Appt 09/19/2023 per patient  Procedures:  No procedures performed Allergies: Patient has no known allergies.   Assessment / Plan:     Visit Diagnoses: Rheumatoid arthritis with rheumatoid factor of multiple sites without organ or systems involvement (HCC) - Rheumatoid factor was 16 and CRP was within normal limits on 11/16/2023.  Patient had no synovitis on examination.  She has been doing well on hydroxychloroquine  200 mg p.o. daily without any interruption.  High risk medication use - Plaquenil  200  mg 1 tablet by mouth daily. PLQ eye exam: 09/21/2023.  CBC and CMP were normal on June 05, 2024.  January 07, 2024 lipid panel was normal.  Hemoglobin A1c was 5.1.  She was advised to have repeat labs in 5 months.  Information regarding immunization was placed in the AVS.  Annual examination to monitor for ocular toxicity was advised.  Primary osteoarthritis of both knees-not symptomatic.  Osteopenia of multiple sites - DEXA 04/15/2023: Left total hip BMD 0.669, T-score -2.3.  -7% change in BMD total left femur.  -9% change in BMD right total femur.  -6% change in BMD AP spine.  I discussed the option of bisphosphonates but patient would like to hold off.  She has been taking calcium and vitamin D .  Vitamin D  was normal at 62 on August 05, 2023.  She has been walking daily about 3-1/2 miles and also lifting weights.  She would repeat DEXA scan next year.  Chronic nonintractable headache, unspecified headache type  Malignant neoplasm of upper-outer quadrant of left breast in female, estrogen receptor positive (HCC)  Orders: No orders of the defined types were placed in this encounter.  No orders of the defined types were placed in this encounter.    Follow-Up Instructions: Return in about 5 months (around 12/05/2024) for Rheumatoid arthritis.   Maya Nash, MD  Note - This  record has been created using Animal nutritionist.  Chart creation errors have been sought, but may not always  have been located. Such creation errors do not reflect on  the standard of medical care.

## 2024-07-05 ENCOUNTER — Encounter: Payer: Self-pay | Admitting: Rheumatology

## 2024-07-05 ENCOUNTER — Ambulatory Visit: Attending: Rheumatology | Admitting: Rheumatology

## 2024-07-05 VITALS — BP 101/68 | HR 75 | Temp 98.3°F | Resp 12 | Ht 63.0 in | Wt 121.0 lb

## 2024-07-05 DIAGNOSIS — M0579 Rheumatoid arthritis with rheumatoid factor of multiple sites without organ or systems involvement: Secondary | ICD-10-CM | POA: Diagnosis not present

## 2024-07-05 DIAGNOSIS — Z79899 Other long term (current) drug therapy: Secondary | ICD-10-CM

## 2024-07-05 DIAGNOSIS — M0600 Rheumatoid arthritis without rheumatoid factor, unspecified site: Secondary | ICD-10-CM

## 2024-07-05 DIAGNOSIS — M8589 Other specified disorders of bone density and structure, multiple sites: Secondary | ICD-10-CM | POA: Diagnosis not present

## 2024-07-05 DIAGNOSIS — G8929 Other chronic pain: Secondary | ICD-10-CM

## 2024-07-05 DIAGNOSIS — R519 Headache, unspecified: Secondary | ICD-10-CM

## 2024-07-05 DIAGNOSIS — Z170 Estrogen receptor positive status [ER+]: Secondary | ICD-10-CM

## 2024-07-05 DIAGNOSIS — M170 Bilateral primary osteoarthritis of knee: Secondary | ICD-10-CM

## 2024-07-05 DIAGNOSIS — C50412 Malignant neoplasm of upper-outer quadrant of left female breast: Secondary | ICD-10-CM

## 2024-07-05 NOTE — Patient Instructions (Signed)
 Vaccines You are taking a medication(s) that can suppress your immune system.  The following immunizations are recommended: Flu annually Covid-19  Td/Tdap (tetanus, diphtheria, pertussis) every 10 years Pneumonia (Prevnar 15 then Pneumovax 23 at least 1 year apart.  Alternatively, can take Prevnar 20 without needing additional dose) Shingrix: 2 doses from 4 weeks to 6 months apart  Please check with your PCP to make sure you are up to date.

## 2024-07-25 DIAGNOSIS — D2262 Melanocytic nevi of left upper limb, including shoulder: Secondary | ICD-10-CM | POA: Diagnosis not present

## 2024-07-25 DIAGNOSIS — D225 Melanocytic nevi of trunk: Secondary | ICD-10-CM | POA: Diagnosis not present

## 2024-07-25 DIAGNOSIS — Z1283 Encounter for screening for malignant neoplasm of skin: Secondary | ICD-10-CM | POA: Diagnosis not present

## 2024-08-31 LAB — OPHTHALMOLOGY REPORT-SCANNED

## 2024-09-06 ENCOUNTER — Other Ambulatory Visit: Payer: Self-pay | Admitting: Rheumatology

## 2024-09-06 DIAGNOSIS — M0600 Rheumatoid arthritis without rheumatoid factor, unspecified site: Secondary | ICD-10-CM

## 2024-09-06 NOTE — Telephone Encounter (Signed)
 Last Fill: 06/01/2024  Eye exam: 08/31/2024 WNL    Labs: 06/05/2024 CBC WNL Globulin is borderline low.  Alk phos is borderline low. Rest of CMP WNL.   Next Visit: 12/13/2024  Last Visit: 07/05/2024  DX: Rheumatoid arthritis with rheumatoid factor of multiple sites without organ or systems involvement   Current Dose per office note 07/05/2024: Plaquenil  200 mg 1 tablet by mouth daily.   Okay to refill Plaquenil ?

## 2024-09-07 DIAGNOSIS — H02054 Trichiasis without entropian left upper eyelid: Secondary | ICD-10-CM | POA: Diagnosis not present

## 2024-09-07 DIAGNOSIS — H16142 Punctate keratitis, left eye: Secondary | ICD-10-CM | POA: Diagnosis not present

## 2024-11-20 ENCOUNTER — Encounter: Payer: Self-pay | Admitting: Rheumatology

## 2024-11-21 NOTE — Telephone Encounter (Signed)
 November 15, 2024 CBC and CMP normal.  Vitamin D  normal.  She should have repeat labs in June.

## 2024-11-29 NOTE — Progress Notes (Unsigned)
 "  Office Visit Note  Patient: Stacey Craig             Date of Birth: [birth year]           MRN: [id number]             PCP: Rollene Almarie LABOR, MD Referring: Rollene Almarie LABOR, MD Visit Date: 12/13/2024 Occupation: Data Unavailable  Subjective:  No chief complaint on file.   History of Present Illness: Stacey Craig is a 64 y.o. female ***     Activities of Daily Living:  Patient reports morning stiffness for *** {minute/hour:19697}.   Patient {ACTIONS;DENIES/REPORTS:21021675::Denies} nocturnal pain.  Difficulty dressing/grooming: {ACTIONS;DENIES/REPORTS:21021675::Denies} Difficulty climbing stairs: {ACTIONS;DENIES/REPORTS:21021675::Denies} Difficulty getting out of chair: {ACTIONS;DENIES/REPORTS:21021675::Denies} Difficulty using hands for taps, buttons, cutlery, and/or writing: {ACTIONS;DENIES/REPORTS:21021675::Denies}  No Rheumatology ROS completed.   PMFS History:  Patient Active Problem List   Diagnosis Date Noted   Ventral hernia 01/14/2024   Word finding difficulty 06/06/2020   HX: breast cancer 12/20/2019   Routine general medical examination at a health care facility 09/12/2018   Chronic headaches 11/03/2017   Rheumatoid arthritis (HCC) 06/21/2015    Past Medical History:  Diagnosis Date   Arthritis    Breast cancer (HCC)    Colon polyps    Genital warts    Personal history of radiation therapy    UTI (lower urinary tract infection)     Family History  Problem Relation Age of Onset   Cancer Father        skin   Heart disease Father    Stroke Father    Brain cancer Father    Bladder Cancer Father    Breast cancer Maternal Grandmother 86 - 69   Cancer Maternal Grandmother    Lung cancer Paternal Grandmother    Cancer Paternal Grandmother    Bone cancer Paternal Grandfather    Cancer Paternal Grandfather    Breast cancer Cousin 53   Cancer Daughter    Past Surgical History:  Procedure  Laterality Date   AUGMENTATION MAMMAPLASTY Bilateral 2002   BREAST BIOPSY Right 2011   BREAST LUMPECTOMY Left 12/04/2019   BREAST SURGERY     COSMETIC SURGERY  breast augmentation   around 2001   FOREARM SURGERY Left    SKIN SURGERY  03/2020   chest, back    Social History[1] Social History   Social History Narrative   Right handed    Caffeine- 6 cups daily      Immunization History  Administered Date(s) Administered   PFIZER(Purple Top)SARS-COV-2 Vaccination 01/22/2020, 02/07/2020   Td 10/26/1990   Tdap 12/10/2021   Zoster Recombinant(Shingrix ) 09/12/2018, 05/18/2019     Objective: Vital Signs: LMP 11/13/2011    Physical Exam   Musculoskeletal Exam: ***  CDAI Exam: CDAI Score: -- Patient Global: --; Provider Global: -- Swollen: --; Tender: -- Joint Exam 12/13/2024   No joint exam has been documented for this visit   There is currently no information documented on the homunculus. Go to the Rheumatology activity and complete the homunculus joint exam.  Investigation: No additional findings.  Imaging: No results found.  Recent Labs: Lab Results  Component Value Date   WBC 4.4 06/05/2024   HGB 13.4 06/05/2024   PLT 249 06/05/2024   NA 139 06/05/2024   K 4.6 06/05/2024   CL 102 06/05/2024   CO2 28 06/05/2024   GLUCOSE 93 06/05/2024   BUN 7 06/05/2024   CREATININE 0.71 06/05/2024   BILITOT 0.6 06/05/2024  ALKPHOS 36 01/17/2024   AST 20 06/05/2024   ALT 16 06/05/2024   PROT 6.4 06/05/2024   ALBUMIN 4.7 11/27/2019   CALCIUM 9.7 06/05/2024   GFRAA 112 02/21/2021    Speciality Comments: PLQ eye exam: 08/31/2024 WNL @ [facility]. Follow up in 6 months.  Procedures:  No procedures performed Allergies: Patient has no known allergies.   Assessment / Plan:     Visit Diagnoses: No diagnosis found.  Orders: No orders of the defined types were placed in this encounter.  No orders of the defined types were placed in this  encounter.   Face-to-face time spent with patient was *** minutes. Greater than 50% of time was spent in counseling and coordination of care.  Follow-Up Instructions: No follow-ups on file.   Daved JAYSON Gavel, CMA  Note - This record has been created using Animal nutritionist.  Chart creation errors have been sought, but may not always  have been located. Such creation errors do not reflect on  the standard of medical care.     [1] Social History Tobacco Use   Smoking status: Former    Current packs/day: 0.00    Average packs/day: 0.5 packs/day for 5.0 years (2.5 ttl pk-yrs)    Types: Cigarettes    Start date: 02/22/1989    Quit date: 02/22/1994    Years since quitting: 30.7    Passive exposure: Never   Smokeless tobacco: Never  Vaping Use   Vaping status: Never Used  Substance Use Topics   Alcohol use: No    Alcohol/week: 0.0 standard drinks of alcohol   Drug use: No  "

## 2024-12-13 ENCOUNTER — Ambulatory Visit: Admitting: Rheumatology

## 2024-12-13 DIAGNOSIS — M170 Bilateral primary osteoarthritis of knee: Secondary | ICD-10-CM

## 2024-12-13 DIAGNOSIS — M0579 Rheumatoid arthritis with rheumatoid factor of multiple sites without organ or systems involvement: Secondary | ICD-10-CM

## 2024-12-13 DIAGNOSIS — Z79899 Other long term (current) drug therapy: Secondary | ICD-10-CM

## 2024-12-13 DIAGNOSIS — G8929 Other chronic pain: Secondary | ICD-10-CM

## 2024-12-13 DIAGNOSIS — M8589 Other specified disorders of bone density and structure, multiple sites: Secondary | ICD-10-CM

## 2024-12-13 DIAGNOSIS — C50412 Malignant neoplasm of upper-outer quadrant of left female breast: Secondary | ICD-10-CM
